# Patient Record
Sex: Female | Born: 1990 | Race: Black or African American | Hispanic: No | Marital: Single | Smoking: Never smoker
Health system: Southern US, Community
[De-identification: ages and names within clinical notes are randomized; demographics above are authoritative.]

## PROBLEM LIST (undated history)

## (undated) DIAGNOSIS — D649 Anemia, unspecified: Secondary | ICD-10-CM

## (undated) HISTORY — DX: Anemia, unspecified: D64.9

## (undated) HISTORY — PX: NO PAST SURGERIES: SHX2092

---
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
## 2011-12-02 ENCOUNTER — Inpatient Hospital Stay: Payer: Self-pay | Admitting: Student

## 2011-12-02 LAB — CBC
HCT: 32.8 % — ABNORMAL LOW (ref 35.0–47.0)
MCH: 26.3 pg (ref 26.0–34.0)
MCV: 82 fL (ref 80–100)
Platelet: 295 10*3/uL (ref 150–440)
RBC: 3.99 10*6/uL (ref 3.80–5.20)
WBC: 7.2 10*3/uL (ref 3.6–11.0)

## 2011-12-02 LAB — URINALYSIS, COMPLETE
Bilirubin,UR: NEGATIVE
Blood: NEGATIVE
Ketone: NEGATIVE
Ph: 5 (ref 4.5–8.0)
Specific Gravity: 1.018 (ref 1.003–1.030)
Squamous Epithelial: 1

## 2011-12-02 LAB — COMPREHENSIVE METABOLIC PANEL
Albumin: 4.3 g/dL (ref 3.8–5.6)
Alkaline Phosphatase: 47 U/L — ABNORMAL LOW (ref 82–169)
Anion Gap: 11 (ref 7–16)
BUN: 11 mg/dL (ref 7–18)
Bilirubin,Total: 0.3 mg/dL (ref 0.2–1.0)
Co2: 24 mmol/L (ref 21–32)
Creatinine: 0.83 mg/dL (ref 0.60–1.30)
EGFR (African American): >60
EGFR (Non-African Amer.): >60
Glucose: 86 mg/dL (ref 65–99)
Osmolality: 278 (ref 275–301)
Potassium: 4 mmol/L (ref 3.5–5.1)
SGPT (ALT): 12 U/L
Sodium: 140 mmol/L (ref 136–145)
Total Protein: 8.6 g/dL (ref 6.4–8.6)

## 2011-12-02 LAB — DRUG SCREEN, URINE
Cocaine Metabolite,Ur ~~LOC~~: NEGATIVE (ref ?–300)
MDMA (Ecstasy)Ur Screen: NEGATIVE (ref ?–500)
Opiate, Ur Screen: NEGATIVE (ref ?–300)
Tricyclic, Ur Screen: NEGATIVE (ref ?–1000)

## 2011-12-02 LAB — PREGNANCY, URINE: Pregnancy Test, Urine: NEGATIVE m[IU]/mL

## 2011-12-02 LAB — HCG, QUANTITATIVE, PREGNANCY: Beta Hcg, Quant.: <1 m[IU]/mL — ABNORMAL LOW

## 2011-12-03 LAB — LIPID PANEL
HDL Cholesterol: 44 mg/dL (ref 40–60)
VLDL Cholesterol, Calc: 6 mg/dL (ref 5–40)

## 2011-12-03 LAB — IRON AND TIBC
Iron Bind.Cap.(Total): 433 ug/dL (ref 250–450)
Iron: 37 ug/dL — ABNORMAL LOW (ref 50–170)
Unbound Iron-Bind.Cap.: 405 ug/dL

## 2011-12-04 LAB — CBC WITH DIFFERENTIAL/PLATELET
Basophil %: 0.1 %
Eosinophil #: 0.1 10*3/uL (ref 0.0–0.7)
HCT: 30.1 % — ABNORMAL LOW (ref 35.0–47.0)
Lymphocyte %: 42.8 %
Monocyte %: 7.7 %
Neutrophil #: 2.4 10*3/uL (ref 1.4–6.5)
Neutrophil %: 48.1 %
RBC: 3.68 10*6/uL — ABNORMAL LOW (ref 3.80–5.20)
RDW: 16 % — ABNORMAL HIGH (ref 11.5–14.5)
WBC: 4.9 10*3/uL (ref 3.6–11.0)

## 2015-03-03 NOTE — H&P (Signed)
PATIENT NAME:  Christina Ellis, Christina Ellis MR#:  [id number] DATE OF BIRTH:  27-Jan-1991  DATE OF ADMISSION:  12/02/2011  PRIMARY CARE PHYSICIAN: Irma Newness., MD   ER REFERRING PHYSICIAN: Eula Listen, MD    CHIEF COMPLAINT: Syncope.   HISTORY OF PRESENT ILLNESS: The patient is a 24 year old female with no past medical history, had an episode of syncope in the bathroom. The patient was coming out of the shower, and then she had an episode of syncope this morning; and she felt light-headed and dizzy before she passed out. She lost consciousness for two minutes, did not have any episodes of seizures and no incontinence. The family heard a big thud in the bathroom, and then the mother noticed she was a little confused. After that, her orientation was normal. The patient says that she has been having headache since the last 2 to 3 days, but it gets better after she eats. No trouble breathing. No chest pain. No nausea. No vomiting. No fever. No neck pain. No blurred vision. No palpitations.   PAST MEDICAL HISTORY: No hypertension, no diabetes. The patient's periods are regular, and she does not use any contraception and is not on birth control pills.   PAST SURGICAL HISTORY: None.   MEDICATIONS: None.   ALLERGIES: None.   SOCIAL HISTORY: No smoking, no drinking, no drugs. The patient [place] a sophomore at [employer] and plays volleyball and basketball.   FAMILY HISTORY: No history of deep venous thrombosis or PE.  REVIEW OF SYSTEMS: CONSTITUTIONAL: No fever. No fatigue. EYES: No blurred vision or double vision. ENT: No ear pain. No hearing loss. No difficulty swallowing. RESPIRATORY: No cough. No wheezing. CARDIOVASCULAR: No chest pain, no palpitations. GASTROINTESTINAL: No nausea. No vomiting. No abdominal pain. GENITOURINARY: No dysuria.  GYN: Periods are regular, not using any contraceptive pills, and not using birth control pills. ENDOCRINE: No polydipsia or nocturia. MUSCULOSKELETAL: No joint pain.  NEUROLOGIC: No numbness or weakness at this time in the hands or legs. No dysarthria. Headache is present, around 4 out of 10 in severity.   PHYSICAL EXAMINATION:  VITAL SIGNS: Blood pressure is 131/69, pulse is 80, respirations are 20, temperature 98.6.   GENERAL: The patient is alert, awake, oriented. A well-nourished female answering questions appropriately.   HEENT: Head: Atraumatic, normocephalic. Pupils are equally reacting to light. Extraocular movements are intact. The patient has no conjunctivitis. Hearing is intact. No turbinate hypertrophy. No oropharyngeal erythema.   NECK: No carotid bruit. No thyroid enlargement.   RESPIRATORY: Bilaterally clear to auscultation. No wheeze. No rales.   CARDIOVASCULAR: S1, S2 regular. No murmurs.   ABDOMEN: Soft, nontender, nondistended. Bowel sounds are present.   MUSCULOSKELETAL: Power 5 out of 5 in upper and lower extremities.   SKIN: The patient has no skin rashes.   LYMPH NODES: No lymphadenopathy.   NEUROLOGICAL: The patient's cranial nerves are intact II to XII. Power is 5 out of 5 in upper and lower extremities. Sensations are intact.    PSYCHIATRIC: Oriented to time, place, and person.   LABORATORY, DIAGNOSTIC AND RADIOLOGICAL DATA:  Chest x-ray showed no acute cardiopulmonary abnormality.  Left hip x-ray is normal.  CAT scan of the head showed lucency in the left lenticular nucleus which is like a lacunar infarct.  Electrolytes: Sodium 140, potassium 4, chloride 105, bicarbonate 24, BUN 11, creatinine 0.83, glucose 86.  Liver functions are within normal limits.  Pregnancy test is negative.  WBC 7.2, hemoglobin 10.3, hematocrit 32.8, and platelets 295.  Urinalysis:  Urine showed yellow hazy urine. No leukocyte esterase.  Urine toxicology is negative.  EKG showed normal sinus with 64 beats per minute. No ST-T changes.   ASSESSMENT AND PLAN: This is a 24 year old female with sudden onset of syncope and lacunar stroke on the  CAT scan. I will admit the patient and have a full stroke work-up in inpatient, including hypercoagulation work-up, and ANA, and also carotid ultrasound, and echocardiogram. She will have neuro checks every four hours. This was discussed with Dr. Loletta Specter, who is going to see the patient. The patient will also have MRA of the brain and use Tylenol p.r.n. for headache. We will monitor her on telemetry.   I discussed the plan with the patient's mother, and I also spoke with Dr. Loletta Specter over the phone.   TIME SPENT ON HISTORY AND PHYSICAL: About 55 minutes.   ____________________________ Epifanio Lesches, MD sk:cbb D: 12/02/2011 17:07:13 ET T: 12/02/2011 18:59:58 ET JOB#: [id number]  cc: Epifanio Lesches, MD, <Dictator> Frederik Schmidt., MD Epifanio Lesches MD ELECTRONICALLY SIGNED 12/28/2011 16:17

## 2015-03-03 NOTE — Discharge Summary (Signed)
PATIENT NAME:  Christina Ellis, Christina Ellis MR#:  [id number] DATE OF BIRTH:  Apr 02, 1991  DATE OF ADMISSION:  12/02/2011 DATE OF DISCHARGE:  12/04/2011  CONSULTANTS:  1. Michaela Corner, MD - Neurology.  2. Physical Therapy.   CHIEF COMPLAINT: Syncope.   DISCHARGE DIAGNOSES:  1. Syncope, likely situational. 2. Iron deficiency anemia.   DISCHARGE MEDICATIONS:  1. Iron sulfate 325 mg p.o. twice a day with meals. 2. Tylenol extra-strength 500 mg 2 tabs orally as needed for pain.   DIET: Regular diet.   ACTIVITY: As tolerated.   DISCHARGE FOLLOWUP: Please follow up with your OB/GYN and repeat a complete blood count in 2 to 4 weeks.   HISTORY OF PRESENT ILLNESS: The patient is a 24 year old African American female with no significant past medical history who presented with a syncopal episode on the day of admission. She had lost consciousness for 1 to 2 minutes. There was some dizziness prior and some headaches the last several days before that. For full history and physical, please see the dictation on 12/02/2011 by Dr. Epifanio Lesches.   Initial CAT scan of the head showed possible lacunar stroke and the patient was admitted to the hospitalist service for further evaluation and management.   SIGNIFICANT LABS/STUDIES: TSH was 0.916. Urine toxicology was essentially negative. Initial WBC was 7.2, hemoglobin 10.5, hematocrit 32.8, and platelets 295.   Urinalysis: No evidence for infection.  Vitamin B12, serum, 311 ANA comprehensive panel was essentially negative. Protein C and S panel essentially negative. Pregnancy test was negative.   Echocardiogram showed an ejection fraction of greater than 55%, borderline mitral valve prolapse and trace mitral regurgitation.  Carotid ultrasound showed no significant stenosis.   Initial CT of the brain showed losses in the left ventricular nucleus, possibly lacunar infarct.   X-ray of the chest, PA and lateral: No acute cardiopulmonary abnormalities.    MRI of the brain initially read as lacunar infarct in the left ventricular nucleus; however, ten section axial and coronal images obtained revealed that this was likely prominent CSF space and not an infarct.   HOSPITAL COURSE: The patient was admitted to telemetry and per the initial CAT scan extensive work-up was done to evaluate the stroke. Also neurology was consulted. The patient was also placed on telemetry to reevaluate for any significant arrhythmias. Upon further workup and conversation with the patient, the [place] the syncope. She had stood outside without any water or food for greater than 10 hours. She felt dizziness the night before and the day prior to the syncopal episode. Echocardiogram came back essentially normal as well as carotid ultrasounds. There was no significant valvular abnormality and ejection fraction was normal on the Echo. There was some borderline mitral valve prolapse, however. Dr. Loletta Specter was consulted and I discussed the case  with him. The lacunar infarct on MRI, on over-read, was more likely prominent CSF space and the MRI was repeated with thin slices and corrected. The patient has no CVA. The patient was also seen by physical therapy and was deemed appropriate to be discharged to home. Her lipid panel was within normal limits. Of note, the patient had low hemoglobin and hematocrit, iron studies were sent, and the patient apparently is iron deficient. She has history of anemia in the past. She can follow up with her OB/GYN as her menses are irregular and states at times has heavier bleeds. At this point, we will discharge her with iron sulfate 325 mg  p.o. twice a day and CBC could be checked in two to four weeks. The patient currently is ambulating without any significant dizziness or symptoms. We will discharge the patient to the care of her mother.    CODE STATUS: FULL CODE.   TOTAL TIME SPENT: 35 minutes.    ____________________________ Vivien Presto, MD sa:slb D: 12/04/2011 11:20:28 ET T: 12/04/2011 11:54:51 ET JOB#: [id number]  cc: Vivien Presto, MD, <Dictator> Vivien Presto MD ELECTRONICALLY SIGNED 12/08/2011 18:41

## 2017-01-31 ENCOUNTER — Emergency Department: Payer: Commercial Managed Care - PPO

## 2017-01-31 ENCOUNTER — Encounter: Payer: Self-pay | Admitting: *Deleted

## 2017-01-31 ENCOUNTER — Emergency Department
Admission: EM | Admit: 2017-01-31 | Discharge: 2017-01-31 | Disposition: A | Discharge status: Home / Self Care | Payer: Commercial Managed Care - PPO | Attending: Emergency Medicine | Admitting: Emergency Medicine

## 2017-01-31 DIAGNOSIS — R05 Cough: Secondary | ICD-10-CM | POA: Diagnosis present

## 2017-01-31 DIAGNOSIS — B349 Viral infection, unspecified: Secondary | ICD-10-CM | POA: Diagnosis not present

## 2017-01-31 MED ORDER — ONDANSETRON 4 MG PO TBDP
4.0000 mg | ORAL_TABLET | Freq: Once | ORAL | Status: AC
  Administered 2017-01-31: 4 mg via ORAL
  Filled 2017-01-31: qty 1

## 2017-01-31 MED ORDER — ONDANSETRON HCL 4 MG PO TABS: 4.0000 mg | ORAL_TABLET | Freq: Three times a day (TID) | ORAL | 0 refills | Status: DC | PRN

## 2017-01-31 NOTE — ED Notes (Signed)
Exposed to flu, symptoms starting x3 days ago. Chest congestion, cough , fever , body aches

## 2017-01-31 NOTE — ED Triage Notes (Signed)
Pt complains of cough , fever, congestion, pt reports vomiting once today

## 2017-01-31 NOTE — ED Provider Notes (Signed)
[facility] Emergency Department Provider Note  ____________________________________________   I have reviewed the triage vital signs and the nursing notes.   HISTORY  Chief Complaint Influenza    HPI Christina Ellis is a 26 y.o. female who presents here with flu symptoms. Her multiple family members have had the flu. Please started on Wednesday. Today is Sunday. Did have 1 episode of posttussive emesis today. Cough, she had she has not had a fever. Her MAXIMUM TEMPERATURE was 98.8 but she did feel warm. She has had body aches. She is otherwise healthy patient. She is concerned because her family member ended up getting a pneumonia on top of the fluid she is worried that that might happen to her. She does not have a productive cough.     History reviewed. No pertinent past medical history.  There are no active problems to display for this patient.   History reviewed. No pertinent surgical history.  Prior to Admission medications   Not on File    Allergies Patient has no allergy information on record.  No family history on file.  Social History Social History  Substance Use Topics  . Smoking status: Never Smoker  . Smokeless tobacco: Not on file  . Alcohol use No    Review of Systems Constitutional: No fever/ Positive chills Eyes: No visual changes. ENT: No sore throat. No stiff neck no neck pain Cardiovascular: Denies chest pain. Respiratory: Denies shortness of breath. Gastrointestinal:   Did vomit 1 after coughing .  No diarrhea.  No constipation. Genitourinary: Negative for dysuria. Musculoskeletal: Negative lower extremity swelling Skin: Negative for rash. Neurological: Negative for severe headaches, focal weakness or numbness. 10-point ROS otherwise negative.  ____________________________________________   PHYSICAL EXAM:  VITAL SIGNS: ED Triage Vitals  Enc Vitals Group     BP 01/31/17 1051 118/80     Pulse Rate  01/31/17 1051 93     Resp 01/31/17 1051 20     Temp 01/31/17 1051 99.3 F (37.4 C)     Temp Source 01/31/17 1051 Oral     SpO2 01/31/17 1051 98 %     Weight 01/31/17 1043 215 lb (97.5 kg)     Height 01/31/17 1043 5\' 8"  (1.727 m)     Head Circumference --      Peak Flow --      Pain Score 01/31/17 1106 0     Pain Loc --      Pain Edu? --      Excl. in [place]? --     Constitutional: Alert and oriented. Well appearing and in no acute distress.Patient is nontoxic medically she appears as if she might have the flu Eyes: Conjunctivae are normal. PERRL. EOMI. Head: Atraumatic. Nose: No congestion/rhinnorhea. Mouth/Throat: Mucous membranes are moist.  Oropharynx non-erythematous. Neck: No stridor.   Nontender with no meningismus Cardiovascular: Normal rate, regular rhythm. Grossly normal heart sounds.  Good peripheral circulation. Respiratory: Normal respiratory effort.  No retractions. Lungs CTAB. Abdominal: Soft and nontender. No distention. No guarding no rebound Back:  There is no focal tenderness or step off.  there is no midline tenderness there are no lesions noted. there is no CVA tenderness Musculoskeletal: No lower extremity tenderness, no upper extremity tenderness. No joint effusions, no DVT signs strong distal pulses no edema Neurologic:  Normal speech and language. No gross focal neurologic deficits are appreciated.  Skin:  Skin is warm, dry and intact. No rash noted. Psychiatric: Mood and affect are normal. Speech and  behavior are normal.  ____________________________________________   LABS (all labs ordered are listed, but only abnormal results are displayed)  Labs Reviewed - No data to display ____________________________________________  EKG  I personally interpreted any EKGs ordered by me or triage  ____________________________________________  RADIOLOGY  I reviewed any imaging ordered by me or triage that were performed during my shift and, if possible, patient  and/or family made aware of any abnormal findings. ____________________________________________   PROCEDURES  Procedure(s) performed: None  Procedures  Critical Care performed: None  ____________________________________________   INITIAL IMPRESSION / ASSESSMENT AND PLAN / ED COURSE  Pertinent labs & imaging results that were available during my care of the patient were reviewed by me and considered in my medical decision making (see chart for details).  Patient with flu symptoms very well-appearing vital signs stable outside the window for Tamiflu, and likely will require supportive care. We'll obtain a chest x-ray as a precaution. We'll give her antiemetics.    ____________________________________________   FINAL CLINICAL IMPRESSION(S) / ED DIAGNOSES  Final diagnoses:  None      This chart was dictated using voice recognition software.  Despite best efforts to proofread,  errors can occur which can change meaning.      Schuyler Amor, MD 01/31/17 [phone]

## 2018-02-19 ENCOUNTER — Emergency Department: Payer: Commercial Managed Care - PPO

## 2018-02-19 ENCOUNTER — Other Ambulatory Visit: Payer: Self-pay

## 2018-02-19 ENCOUNTER — Encounter: Payer: Self-pay | Admitting: Emergency Medicine

## 2018-02-19 ENCOUNTER — Emergency Department
Admission: EM | Admit: 2018-02-19 | Discharge: 2018-02-19 | Disposition: A | Discharge status: Home / Self Care | Payer: Commercial Managed Care - PPO | Attending: Emergency Medicine | Admitting: Emergency Medicine

## 2018-02-19 DIAGNOSIS — S0083XA Contusion of other part of head, initial encounter: Secondary | ICD-10-CM | POA: Diagnosis not present

## 2018-02-19 DIAGNOSIS — Y9389 Activity, other specified: Secondary | ICD-10-CM | POA: Diagnosis not present

## 2018-02-19 DIAGNOSIS — Y92410 Unspecified street and highway as the place of occurrence of the external cause: Secondary | ICD-10-CM | POA: Diagnosis not present

## 2018-02-19 DIAGNOSIS — Y998 Other external cause status: Secondary | ICD-10-CM | POA: Insufficient documentation

## 2018-02-19 DIAGNOSIS — S060X0A Concussion without loss of consciousness, initial encounter: Secondary | ICD-10-CM | POA: Insufficient documentation

## 2018-02-19 DIAGNOSIS — S0990XA Unspecified injury of head, initial encounter: Secondary | ICD-10-CM | POA: Diagnosis present

## 2018-02-19 MED ORDER — ONDANSETRON 8 MG PO TBDP
8.0000 mg | ORAL_TABLET | Freq: Once | ORAL | Status: AC
  Administered 2018-02-19: 8 mg via ORAL
  Filled 2018-02-19: qty 1

## 2018-02-19 MED ORDER — MELOXICAM 15 MG PO TABS: 15.0000 mg | ORAL_TABLET | Freq: Every day | ORAL | 0 refills | Status: DC

## 2018-02-19 MED ORDER — ORPHENADRINE CITRATE 30 MG/ML IJ SOLN
60.0000 mg | Freq: Once | INTRAMUSCULAR | Status: AC
  Administered 2018-02-19: 60 mg via INTRAMUSCULAR
  Filled 2018-02-19: qty 2

## 2018-02-19 MED ORDER — KETOROLAC TROMETHAMINE 30 MG/ML IJ SOLN
30.0000 mg | Freq: Once | INTRAMUSCULAR | Status: AC
  Administered 2018-02-19: 30 mg via INTRAMUSCULAR
  Filled 2018-02-19: qty 1

## 2018-02-19 MED ORDER — METHOCARBAMOL 500 MG PO TABS: 500.0000 mg | ORAL_TABLET | Freq: Four times a day (QID) | ORAL | 0 refills | Status: DC
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
## 2019-07-21 ENCOUNTER — Other Ambulatory Visit: Payer: Self-pay | Admitting: *Deleted

## 2019-07-21 DIAGNOSIS — Z20822 Contact with and (suspected) exposure to covid-19: Secondary | ICD-10-CM

## 2019-07-23 LAB — NOVEL CORONAVIRUS, NAA: SARS-CoV-2, NAA: NOT DETECTED

## 2019-08-21 ENCOUNTER — Other Ambulatory Visit: Payer: Self-pay

## 2019-08-21 DIAGNOSIS — Z20822 Contact with and (suspected) exposure to covid-19: Secondary | ICD-10-CM

## 2019-08-22 LAB — NOVEL CORONAVIRUS, NAA: SARS-CoV-2, NAA: DETECTED — AB

## 2019-08-31 ENCOUNTER — Other Ambulatory Visit: Payer: Self-pay

## 2019-08-31 DIAGNOSIS — Z20822 Contact with and (suspected) exposure to covid-19: Secondary | ICD-10-CM

## 2019-09-02 LAB — NOVEL CORONAVIRUS, NAA: SARS-CoV-2, NAA: NOT DETECTED

## 2020-03-05 DIAGNOSIS — E119 Type 2 diabetes mellitus without complications: Secondary | ICD-10-CM | POA: Insufficient documentation

## 2020-03-06 ENCOUNTER — Ambulatory Visit: Payer: Commercial Managed Care - PPO | Attending: Internal Medicine

## 2020-03-06 DIAGNOSIS — Z23 Encounter for immunization: Secondary | ICD-10-CM

## 2020-03-06 NOTE — Progress Notes (Signed)
   Covid-19 Vaccination Clinic  Name:  Christina Ellis    MRN: [id number] DOB: 09-09-1991  03/06/2020  Christina Ellis was observed post Covid-19 immunization for 15 minutes without incident. She was provided with Vaccine Information Sheet and instruction to access the V-Safe system.   Christina Ellis was instructed to call 911 with any severe reactions post vaccine: Marland Kitchen Difficulty breathing  . Swelling of face and throat  . A fast heartbeat  . A bad rash all over body  . Dizziness and weakness   Immunizations Administered    Name Date Dose VIS Date Route   Pfizer COVID-19 Vaccine 03/06/2020  1:40 PM 0.3 mL 01/03/2019 Intramuscular   Manufacturer: [place]   Lot: [id number]   [place]: [id number]

## 2020-04-01 ENCOUNTER — Ambulatory Visit: Payer: Commercial Managed Care - PPO | Attending: Internal Medicine

## 2020-04-01 DIAGNOSIS — Z23 Encounter for immunization: Secondary | ICD-10-CM

## 2020-04-01 NOTE — Progress Notes (Signed)
   Covid-19 Vaccination Clinic  Name:  Christina Ellis    MRN: [id number] DOB: [birth year]  04/01/2020  [name] was observed post Covid-19 immunization for 15 minutes without incident. She was provided with Vaccine Information Sheet and instruction to access the V-Safe system.   [name] was instructed to call 911 with any severe reactions post vaccine: Marland Kitchen Difficulty breathing  . Swelling of face and throat  . A fast heartbeat  . A bad rash all over body  . Dizziness and weakness   Immunizations Administered    Name Date Dose VIS Date Route   Pfizer COVID-19 Vaccine 04/01/2020  9:48 AM 0.3 mL 01/03/2019 Intramuscular   Manufacturer: [place]   Lot: [id number]   [place]: [id number]

## 2020-12-23 DIAGNOSIS — M549 Dorsalgia, unspecified: Secondary | ICD-10-CM | POA: Diagnosis not present

## 2020-12-23 DIAGNOSIS — Z0000 Encounter for general adult medical examination without abnormal findings: Secondary | ICD-10-CM | POA: Diagnosis not present

## 2020-12-23 DIAGNOSIS — E119 Type 2 diabetes mellitus without complications: Secondary | ICD-10-CM | POA: Diagnosis not present

## 2020-12-23 DIAGNOSIS — Z1331 Encounter for screening for depression: Secondary | ICD-10-CM | POA: Diagnosis not present

## 2020-12-23 DIAGNOSIS — D509 Iron deficiency anemia, unspecified: Secondary | ICD-10-CM | POA: Diagnosis not present

## 2020-12-24 DIAGNOSIS — D509 Iron deficiency anemia, unspecified: Secondary | ICD-10-CM | POA: Diagnosis not present

## 2020-12-24 DIAGNOSIS — E119 Type 2 diabetes mellitus without complications: Secondary | ICD-10-CM | POA: Diagnosis not present

## 2021-01-10 ENCOUNTER — Other Ambulatory Visit: Payer: Self-pay

## 2021-01-10 ENCOUNTER — Inpatient Hospital Stay: Payer: 59

## 2021-01-10 ENCOUNTER — Encounter: Payer: Self-pay | Admitting: Internal Medicine

## 2021-01-10 ENCOUNTER — Inpatient Hospital Stay: Payer: 59 | Attending: Internal Medicine | Admitting: Internal Medicine

## 2021-01-10 VITALS — BP 129/84 | HR 65 | Temp 98.2°F | Resp 20 | Ht 67.0 in | Wt 234.0 lb

## 2021-01-10 DIAGNOSIS — Z800 Family history of malignant neoplasm of digestive organs: Secondary | ICD-10-CM | POA: Insufficient documentation

## 2021-01-10 DIAGNOSIS — Z79899 Other long term (current) drug therapy: Secondary | ICD-10-CM | POA: Diagnosis not present

## 2021-01-10 DIAGNOSIS — D500 Iron deficiency anemia secondary to blood loss (chronic): Secondary | ICD-10-CM | POA: Diagnosis not present

## 2021-01-10 DIAGNOSIS — N920 Excessive and frequent menstruation with regular cycle: Secondary | ICD-10-CM | POA: Diagnosis not present

## 2021-01-10 DIAGNOSIS — Z809 Family history of malignant neoplasm, unspecified: Secondary | ICD-10-CM | POA: Insufficient documentation

## 2021-01-10 NOTE — Assessment & Plan Note (Addendum)
#  Iron deficient anemia likely secondary to chronic blood loss/menorrhagia.  Hemoglobin around 9.  Given the severe iron deficiency would recommend IV iron.  Discussed the potential acute infusion reactions with IV iron; which are quite rare.  Patient understands the risk; will proceed with infusions.  #Menorrhagia-recommend GYN evaluation.  Thank you  for allowing me to participate in the care of your pleasant patient. Please do not hesitate to contact me with questions or concerns in the interim.  # DISPOSITION: # [facility]-re: heavy menstrual cycles # venofer weekly x4- start mid-next week # follow up in 7 weeks- MD; labs- cbc/urine pregnancy- possible venofer Dr.B

## 2021-01-10 NOTE — Progress Notes (Signed)
[place] CONSULT NOTE  Patient Care Team: Patient, No Pcp Per as PCP - General (General Practice)  CHIEF COMPLAINTS/PURPOSE OF CONSULTATION: ANEMIA   HEMATOLOGY HISTORY:  # IRON DEF ANEMIA-likely secondary menorrhagia [EGD/colonoscopy/capsule/Bone marrow Biopsy-NONE]  HISTORY OF PRESENTING ILLNESS:  Christina Ellis 30 y.o.  female has been referred to [place] for further evaluation/work-up for anemia.  Patient complains of worsening fatigue; shortness of exertion.  Patient has difficulty in concentration.  Patient has history of heavy menstrual cycles.  Blood in stools: None Change in bowel habits- None Blood in urine: None Difficulty swallowing: None Abnormal weight loss: None Iron supplementation: non-compliance sec to nausea.  Prior Blood transfusions: none Bariatric surgery: None EGD/Colonoscopy:none  Vaginal bleeding: heavy cycles.    Review of Systems  Constitutional: Positive for malaise/fatigue. Negative for chills, diaphoresis, fever and weight loss.  HENT: Negative for nosebleeds and sore throat.   Eyes: Negative for double vision.  Respiratory: Negative for cough, hemoptysis, sputum production, shortness of breath and wheezing.   Cardiovascular: Negative for chest pain, palpitations, orthopnea and leg swelling.  Gastrointestinal: Negative for abdominal pain, blood in stool, constipation, diarrhea, heartburn, melena, nausea and vomiting.  Genitourinary: Negative for dysuria, frequency and urgency.  Musculoskeletal: Negative for back pain and joint pain.  Skin: Negative.  Negative for itching and rash.  Neurological: Positive for dizziness. Negative for tingling, focal weakness, weakness and headaches.  Endo/Heme/Allergies: Does not bruise/bleed easily.  Psychiatric/Behavioral: Negative for depression. The patient is not nervous/anxious and does not have insomnia.     MEDICAL HISTORY:  Past Medical History:  Diagnosis Date  . Anemia      SURGICAL HISTORY: No past surgical history on file.  SOCIAL HISTORY: Social History   Socioeconomic History  . Marital status: Single    Spouse name: Not on file  . Number of children: Not on file  . Years of education: Not on file  . Highest education level: Not on file  Occupational History  . Not on file  Tobacco Use  . Smoking status: Never Smoker  . Smokeless tobacco: Never Used  Vaping Use  . Vaping Use: Never used  Substance and Sexual Activity  . Alcohol use: No    Comment: occasional glass of wine  . Drug use: Not on file  . Sexual activity: Not on file  Other Topics Concern  . 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 Lives in [place]; with mom.    Social Determinants of Health   Financial Resource Strain: Not on file  Food Insecurity: Not on file  Transportation Needs: Not on file  Physical Activity: Not on file  Stress: Not on file  Social Connections: Not on file  Intimate Partner Violence: Not on file    FAMILY HISTORY: Family History  Problem Relation Age of Onset  . Anemia Mother   . Thyroid cancer Maternal Grandmother   . Pancreatic cancer Maternal Grandfather     ALLERGIES:  is allergic to latex.  MEDICATIONS:  Current Outpatient Medications  Medication Sig Dispense Refill  . ferrous sulfate 325 (65 FE) MG tablet Take 1 tablet by mouth daily with breakfast.    . Multiple Vitamins-Minerals (MULTIVITAMIN ADULT) CHEW Chew by mouth.    . Omega-3 Fatty Acids (FISH OIL) 1000 MG CAPS Take 1 capsule by mouth in the morning and at bedtime.     No current facility-administered medications for this visit.  PHYSICAL EXAMINATION:   Vitals:   01/10/21 1515  BP: 129/84  Pulse: 65  Resp: 20  Temp: 98.2 F (36.8 C)   Filed Weights   01/10/21 1519  Weight: 234 lb (106.1 kg)    Physical Exam Constitutional:      Comments: Ambulating; with mother.   HENT:     Head:  Normocephalic and atraumatic.     Mouth/Throat:     Mouth: Oropharynx is clear and moist.     Pharynx: No oropharyngeal exudate.  Eyes:     Pupils: Pupils are equal, round, and reactive to light.  Cardiovascular:     Rate and Rhythm: Normal rate and regular rhythm.  Pulmonary:     Effort: Pulmonary effort is normal. No respiratory distress.     Breath sounds: Normal breath sounds. No wheezing.  Abdominal:     General: Bowel sounds are normal. There is no distension.     Palpations: Abdomen is soft. There is no mass.     Tenderness: There is no abdominal tenderness. There is no guarding or rebound.  Musculoskeletal:        General: No tenderness or edema. Normal range of motion.     Cervical back: Normal range of motion and neck supple.  Skin:    General: Skin is warm.  Neurological:     Mental Status: She is alert and oriented to person, place, and time.  Psychiatric:        Mood and Affect: Affect normal.     LABORATORY DATA:  I have reviewed the data as listed Lab Results  Component Value Date   WBC 4.9 12/04/2011   HGB 9.7 (L) 12/04/2011   HCT 30.1 (L) 12/04/2011   MCV 82 12/04/2011   PLT 265 12/04/2011   No results for input(s): NA, K, CL, CO2, GLUCOSE, BUN, CREATININE, CALCIUM, GFRNONAA, GFRAA, PROT, ALBUMIN, AST, ALT, ALKPHOS, BILITOT, BILIDIR, IBILI in the last 8760 hours.   No results found.  Iron deficiency anemia due to chronic blood loss #Iron deficient anemia likely secondary to chronic blood loss/menorrhagia.  Hemoglobin around 9.  Given the severe iron deficiency would recommend IV iron.  Discussed the potential acute infusion reactions with IV iron; which are quite rare.  Patient understands the risk; will proceed with infusions.  #Menorrhagia-recommend GYN evaluation.  Thank you  for allowing me to participate in the care of your pleasant patient. Please do not hesitate to contact me with questions or concerns in the interim.  # DISPOSITION: # [facility]: heavy menstrual cycles # venofer weekly x4- start mid-next week # follow up in 7 weeks- MD; labs- cbc/urine pregnancy- possible venofer Dr.B     All questions were answered. The patient knows to call the clinic with any problems, questions or concerns.      [name], MD 01/13/2021 12:32 PM

## 2021-01-13 ENCOUNTER — Telehealth: Payer: Self-pay

## 2021-01-13 NOTE — Telephone Encounter (Signed)
[facility] referring for Heavy menses; iron def. Anemia. Called and left voicemail for patient to call back to be scheduled.

## 2021-01-14 ENCOUNTER — Inpatient Hospital Stay: Payer: 59

## 2021-01-14 VITALS — BP 113/76 | HR 75 | Temp 97.5°F | Resp 20

## 2021-01-14 DIAGNOSIS — D500 Iron deficiency anemia secondary to blood loss (chronic): Secondary | ICD-10-CM | POA: Diagnosis not present

## 2021-01-14 DIAGNOSIS — N920 Excessive and frequent menstruation with regular cycle: Secondary | ICD-10-CM | POA: Diagnosis not present

## 2021-01-14 DIAGNOSIS — Z800 Family history of malignant neoplasm of digestive organs: Secondary | ICD-10-CM | POA: Diagnosis not present

## 2021-01-14 DIAGNOSIS — Z79899 Other long term (current) drug therapy: Secondary | ICD-10-CM | POA: Diagnosis not present

## 2021-01-14 DIAGNOSIS — Z809 Family history of malignant neoplasm, unspecified: Secondary | ICD-10-CM | POA: Diagnosis not present

## 2021-01-14 MED ORDER — SODIUM CHLORIDE 0.9 % IV SOLN
Freq: Once | INTRAVENOUS | Status: AC
  Filled 2021-01-14: qty 250

## 2021-01-14 MED ORDER — SODIUM CHLORIDE 0.9 % IV SOLN: 200.0000 mg | Freq: Once | INTRAVENOUS | Status: DC

## 2021-01-14 MED ORDER — IRON SUCROSE 20 MG/ML IV SOLN
200.0000 mg | Freq: Once | INTRAVENOUS | Status: AC
  Administered 2021-01-14: 200 mg via INTRAVENOUS
  Filled 2021-01-14: qty 10

## 2021-01-14 NOTE — Telephone Encounter (Signed)
Called and spoke with patient about scheduling appointment. Patient wishes to call back to be scheduled

## 2021-01-21 ENCOUNTER — Inpatient Hospital Stay: Payer: 59

## 2021-01-21 VITALS — BP 110/74 | HR 72 | Temp 96.9°F | Resp 18

## 2021-01-21 DIAGNOSIS — N920 Excessive and frequent menstruation with regular cycle: Secondary | ICD-10-CM | POA: Diagnosis not present

## 2021-01-21 DIAGNOSIS — Z800 Family history of malignant neoplasm of digestive organs: Secondary | ICD-10-CM | POA: Diagnosis not present

## 2021-01-21 DIAGNOSIS — Z809 Family history of malignant neoplasm, unspecified: Secondary | ICD-10-CM | POA: Diagnosis not present

## 2021-01-21 DIAGNOSIS — Z79899 Other long term (current) drug therapy: Secondary | ICD-10-CM | POA: Diagnosis not present

## 2021-01-21 DIAGNOSIS — D500 Iron deficiency anemia secondary to blood loss (chronic): Secondary | ICD-10-CM

## 2021-01-21 MED ORDER — SODIUM CHLORIDE 0.9 % IV SOLN
Freq: Once | INTRAVENOUS | Status: AC
  Filled 2021-01-21: qty 250

## 2021-01-21 MED ORDER — SODIUM CHLORIDE 0.9 % IV SOLN: 200.0000 mg | Freq: Once | INTRAVENOUS | Status: DC

## 2021-01-21 MED ORDER — IRON SUCROSE 20 MG/ML IV SOLN
200.0000 mg | Freq: Once | INTRAVENOUS | Status: AC
  Administered 2021-01-21: 200 mg via INTRAVENOUS
  Filled 2021-01-21: qty 10

## 2021-01-28 ENCOUNTER — Inpatient Hospital Stay: Payer: 59

## 2021-01-28 VITALS — BP 117/76 | HR 71 | Resp 18

## 2021-01-28 DIAGNOSIS — N920 Excessive and frequent menstruation with regular cycle: Secondary | ICD-10-CM | POA: Diagnosis not present

## 2021-01-28 DIAGNOSIS — Z809 Family history of malignant neoplasm, unspecified: Secondary | ICD-10-CM | POA: Diagnosis not present

## 2021-01-28 DIAGNOSIS — D500 Iron deficiency anemia secondary to blood loss (chronic): Secondary | ICD-10-CM | POA: Diagnosis not present

## 2021-01-28 DIAGNOSIS — Z800 Family history of malignant neoplasm of digestive organs: Secondary | ICD-10-CM | POA: Diagnosis not present

## 2021-01-28 DIAGNOSIS — Z79899 Other long term (current) drug therapy: Secondary | ICD-10-CM | POA: Diagnosis not present

## 2021-01-28 MED ORDER — SODIUM CHLORIDE 0.9 % IV SOLN: 200.0000 mg | Freq: Once | INTRAVENOUS | Status: DC

## 2021-01-28 MED ORDER — IRON SUCROSE 20 MG/ML IV SOLN
200.0000 mg | Freq: Once | INTRAVENOUS | Status: AC
  Administered 2021-01-28: 200 mg via INTRAVENOUS
  Filled 2021-01-28: qty 10

## 2021-01-28 MED ORDER — SODIUM CHLORIDE 0.9 % IV SOLN
Freq: Once | INTRAVENOUS | Status: AC
  Filled 2021-01-28: qty 250

## 2021-02-04 ENCOUNTER — Other Ambulatory Visit: Payer: Self-pay

## 2021-02-04 ENCOUNTER — Inpatient Hospital Stay: Payer: 59

## 2021-02-04 VITALS — BP 123/74 | HR 70 | Temp 97.4°F | Resp 18

## 2021-02-04 DIAGNOSIS — D500 Iron deficiency anemia secondary to blood loss (chronic): Secondary | ICD-10-CM

## 2021-02-04 DIAGNOSIS — Z809 Family history of malignant neoplasm, unspecified: Secondary | ICD-10-CM | POA: Diagnosis not present

## 2021-02-04 DIAGNOSIS — N920 Excessive and frequent menstruation with regular cycle: Secondary | ICD-10-CM | POA: Diagnosis not present

## 2021-02-04 DIAGNOSIS — Z79899 Other long term (current) drug therapy: Secondary | ICD-10-CM | POA: Diagnosis not present

## 2021-02-04 DIAGNOSIS — Z800 Family history of malignant neoplasm of digestive organs: Secondary | ICD-10-CM | POA: Diagnosis not present

## 2021-02-04 MED ORDER — SODIUM CHLORIDE 0.9 % IV SOLN
Freq: Once | INTRAVENOUS | Status: AC
  Filled 2021-02-04: qty 250

## 2021-02-04 MED ORDER — SODIUM CHLORIDE 0.9 % IV SOLN: 200.0000 mg | Freq: Once | INTRAVENOUS | Status: DC

## 2021-02-04 MED ORDER — IRON SUCROSE 20 MG/ML IV SOLN
200.0000 mg | Freq: Once | INTRAVENOUS | Status: AC
  Administered 2021-02-04: 200 mg via INTRAVENOUS
  Filled 2021-02-04: qty 10

## 2021-02-04 NOTE — Progress Notes (Signed)
1400: pt states she has not had her period this month. Pt states her last period was the week before she saw Dr. Rogue Bussing ( she saw Dr. Rogue Bussing on 01/10/21, which would put her LMP being the end of Feb (approx 01/03/21)). Pt denies any concerns or possibility of pregnancy. Per Dr. Rogue Bussing okay to proceed with Venofer at this time.    1435: pt olerated infusion well, no s/s of distress or reaction noted. Pt stable at discharge.

## 2021-02-19 ENCOUNTER — Encounter: Payer: Self-pay | Admitting: Podiatry

## 2021-02-19 ENCOUNTER — Other Ambulatory Visit: Payer: Self-pay

## 2021-02-19 ENCOUNTER — Other Ambulatory Visit: Payer: Self-pay | Admitting: Podiatry

## 2021-02-19 ENCOUNTER — Ambulatory Visit (INDEPENDENT_AMBULATORY_CARE_PROVIDER_SITE_OTHER): Payer: 59

## 2021-02-19 ENCOUNTER — Ambulatory Visit: Payer: 59 | Admitting: Podiatry

## 2021-02-19 DIAGNOSIS — G5761 Lesion of plantar nerve, right lower limb: Secondary | ICD-10-CM | POA: Diagnosis not present

## 2021-02-19 DIAGNOSIS — G5781 Other specified mononeuropathies of right lower limb: Secondary | ICD-10-CM

## 2021-02-19 DIAGNOSIS — M778 Other enthesopathies, not elsewhere classified: Secondary | ICD-10-CM

## 2021-02-19 MED ORDER — MELOXICAM 15 MG PO TABS: 15.0000 mg | ORAL_TABLET | Freq: Every day | ORAL | 3 refills | Status: DC

## 2021-02-19 MED ORDER — TRIAMCINOLONE ACETONIDE 40 MG/ML IJ SUSP
20.0000 mg | Freq: Once | INTRAMUSCULAR | Status: AC
  Administered 2021-02-19: 20 mg

## 2021-02-19 NOTE — Progress Notes (Signed)
  Subjective:  Patient ID: Christina Ellis, female    DOB: [birth year],  MRN: [id number] HPI Chief Complaint  Patient presents with  . Foot Pain    Dorsal forefoot right - injury x 1 year ago-dropped something onto foot, increasingly painful since, throbbing, radiates into 3rd and 4th toes and into calf with activity  . New Patient (Initial Visit)    30 y.o. female presents with the above complaint.   ROS: Denies fever chills nausea vomiting muscle aches pains calf pain back pain chest pain shortness of breath.  Past Medical History:  Diagnosis Date  . Anemia    No past surgical history on file.  Current Outpatient Medications:  .  meloxicam (MOBIC) 15 MG tablet, Take 1 tablet (15 mg total) by mouth daily., Disp: 30 tablet, Rfl: 3 .  ferrous sulfate 325 (65 FE) MG tablet, Take 1 tablet by mouth daily with breakfast., Disp: , Rfl:  .  Multiple Vitamins-Minerals (MULTIVITAMIN ADULT) CHEW, Chew by mouth., Disp: , Rfl:  .  Omega-3 Fatty Acids (FISH OIL) 1000 MG CAPS, Take 1 capsule by mouth in the morning and at bedtime., Disp: , Rfl:   Allergies  Allergen Reactions  . Latex Itching   Review of Systems Objective:  There were no vitals filed for this visit.  General: Well developed, nourished, in no acute distress, alert and oriented x3   Dermatological: Skin is warm, dry and supple bilateral. Nails x 10 are well maintained; remaining integument appears unremarkable at this time. There are no open sores, no preulcerative lesions, no rash or signs of infection present.  Vascular: Dorsalis Pedis artery and Posterior Tibial artery pedal pulses are 2/4 bilateral with immedate capillary fill time. Pedal hair growth present. No varicosities and no lower extremity edema present bilateral.   Neruologic: Grossly intact via light touch bilateral. Vibratory intact via tuning fork bilateral. Protective threshold with Semmes Wienstein monofilament intact to all pedal sites bilateral. Patellar  and Achilles deep tendon reflexes 2+ bilateral. No Babinski or clonus noted bilateral.   Musculoskeletal: No gross boney pedal deformities bilateral. No pain, crepitus, or limitation noted with foot and ankle range of motion bilateral. Muscular strength 5/5 in all groups tested bilateral.  Pain on palpation to the third interdigital space of the right foot.  She also has some tenderness on palpation at the fourth fifth tarsometatarsal joint with no heel pain.  Gait: Unassisted, Nonantalgic.    Radiographs:  Radiographs taken today demonstrate an osseously mature individual no fractures are identified no acute findings.  No malalignment.  Assessment & Plan:   Assessment: Neuroma third interspace right foot  Plan: Injected the area today after sterile Betadine skin prep and ethyl chloride.  I injected 10 mg of Kenalog 5 mg Marcaine point of maximal tenderness.  We discussed the possible need for dehydrated alcohol or excision.  She understands this is amenable to it.     Eupha Lobb T. [place], [place]

## 2021-02-28 ENCOUNTER — Other Ambulatory Visit: Payer: Self-pay

## 2021-02-28 ENCOUNTER — Encounter: Payer: Self-pay | Admitting: Internal Medicine

## 2021-02-28 ENCOUNTER — Inpatient Hospital Stay (HOSPITAL_BASED_OUTPATIENT_CLINIC_OR_DEPARTMENT_OTHER): Payer: 59 | Admitting: Internal Medicine

## 2021-02-28 ENCOUNTER — Inpatient Hospital Stay: Payer: 59

## 2021-02-28 ENCOUNTER — Inpatient Hospital Stay: Payer: 59 | Attending: Internal Medicine

## 2021-02-28 VITALS — BP 123/80 | HR 76 | Resp 18

## 2021-02-28 DIAGNOSIS — D500 Iron deficiency anemia secondary to blood loss (chronic): Secondary | ICD-10-CM

## 2021-02-28 DIAGNOSIS — N920 Excessive and frequent menstruation with regular cycle: Secondary | ICD-10-CM | POA: Diagnosis not present

## 2021-02-28 LAB — CBC WITH DIFFERENTIAL/PLATELET
Abs Immature Granulocytes: 0.02 10*3/uL (ref 0.00–0.07)
Basophils Absolute: 0 10*3/uL (ref 0.0–0.1)
Basophils Relative: 1 %
Eosinophils Absolute: 0.1 10*3/uL (ref 0.0–0.5)
Eosinophils Relative: 2 %
HCT: 35.4 % — ABNORMAL LOW (ref 36.0–46.0)
Hemoglobin: 11.1 g/dL — ABNORMAL LOW (ref 12.0–15.0)
Immature Granulocytes: 0 %
Lymphocytes Relative: 36 %
Lymphs Abs: 2.4 10*3/uL (ref 0.7–4.0)
MCH: 26.3 pg (ref 26.0–34.0)
MCHC: 31.4 g/dL (ref 30.0–36.0)
MCV: 83.9 fL (ref 80.0–100.0)
Monocytes Absolute: 0.4 10*3/uL (ref 0.1–1.0)
Monocytes Relative: 6 %
Neutro Abs: 3.7 10*3/uL (ref 1.7–7.7)
Neutrophils Relative %: 55 %
Platelets: 295 10*3/uL (ref 150–400)
RBC: 4.22 MIL/uL (ref 3.87–5.11)
RDW: 21.2 % — ABNORMAL HIGH (ref 11.5–15.5)
WBC: 6.7 10*3/uL (ref 4.0–10.5)
nRBC: 0 % (ref 0.0–0.2)

## 2021-02-28 LAB — PREGNANCY, URINE: Preg Test, Ur: NEGATIVE

## 2021-02-28 MED ORDER — IRON SUCROSE 20 MG/ML IV SOLN
200.0000 mg | Freq: Once | INTRAVENOUS | Status: AC
  Administered 2021-02-28: 200 mg via INTRAVENOUS
  Filled 2021-02-28: qty 10

## 2021-02-28 MED ORDER — SODIUM CHLORIDE 0.9 % IV SOLN
Freq: Once | INTRAVENOUS | Status: AC
  Filled 2021-02-28: qty 250

## 2021-02-28 MED ORDER — SODIUM CHLORIDE 0.9 % IV SOLN: 200.0000 mg | Freq: Once | INTRAVENOUS | Status: DC

## 2021-02-28 NOTE — Progress Notes (Signed)
[facility] NOTE  Patient Care Team: [name], PA-C as PCP - General (Physician Assistant)  CHIEF COMPLAINTS/PURPOSE OF CONSULTATION: ANEMIA   HEMATOLOGY HISTORY:  # IRON DEF ANEMIA-likely secondary menorrhagia [EGD/colonoscopy/capsule/Bone marrow Biopsy-NONE]; JAN hb 9 [PCP] s/p Venofer.   HISTORY OF PRESENTING ILLNESS:  Christina Ellis 30 y.o.  female with iron deficiency anemia secondary to heavy menstrual cycles is here for follow-up.   Patient is currently status post IV iron infusion; notes that improvement of energy levels.  Interestingly notes to have improvement of her menstrual cycles.  Patient is still awaiting to see gynecology.  Constipation with oral iron.   Review of Systems  Constitutional: Positive for malaise/fatigue. Negative for chills, diaphoresis, fever and weight loss.  HENT: Negative for nosebleeds and sore throat.   Eyes: Negative for double vision.  Respiratory: Negative for cough, hemoptysis, sputum production, shortness of breath and wheezing.   Cardiovascular: Negative for chest pain, palpitations, orthopnea and leg swelling.  Gastrointestinal: Negative for abdominal pain, blood in stool, constipation, diarrhea, heartburn, melena, nausea and vomiting.  Genitourinary: Negative for dysuria, frequency and urgency.  Musculoskeletal: Negative for back pain and joint pain.  Skin: Negative.  Negative for itching and rash.  Neurological: Positive for dizziness. Negative for tingling, focal weakness, weakness and headaches.  Endo/Heme/Allergies: Does not bruise/bleed easily.  Psychiatric/Behavioral: Negative for depression. The patient is not nervous/anxious and does not have insomnia.     MEDICAL HISTORY:  Past Medical History:  Diagnosis Date  . Anemia     SURGICAL HISTORY: History reviewed. No pertinent surgical history.  SOCIAL HISTORY: Social History   Socioeconomic History  . Marital status: Single     Spouse name: Not on file  . Number of children: Not on file  . Years of education: Not on file  . Highest education level: Not on file  Occupational History  . Not on file  Tobacco Use  . Smoking status: Never Smoker  . Smokeless tobacco: Never Used  Vaping Use  . Vaping Use: Never used  Substance and Sexual Activity  . Alcohol use: No    Comment: occasional glass of wine  . Drug use: Not on file  . Sexual activity: Not on file  Other Topics Concern  . 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 Lives in [place]; with mom.    Social Determinants of Health   Financial Resource Strain: Not on file  Food Insecurity: Not on file  Transportation Needs: Not on file  Physical Activity: Not on file  Stress: Not on file  Social Connections: Not on file  Intimate Partner Violence: Not on file    FAMILY HISTORY: Family History  Problem Relation Age of Onset  . Anemia Mother   . Thyroid cancer Maternal Grandmother   . Pancreatic cancer Maternal Grandfather     ALLERGIES:  is allergic to latex.  MEDICATIONS:  Current Outpatient Medications  Medication Sig Dispense Refill  . ferrous sulfate 325 (65 FE) MG tablet Take 1 tablet by mouth daily with breakfast.    . meloxicam (MOBIC) 15 MG tablet Take 1 tablet (15 mg total) by mouth daily. 30 tablet 3  . Multiple Vitamins-Minerals (MULTIVITAMIN ADULT) CHEW Chew by mouth.    . Omega-3 Fatty Acids (FISH OIL) 1000 MG CAPS Take 1 capsule by mouth in the morning and at bedtime.     No current facility-administered medications for this visit.  PHYSICAL EXAMINATION:   Vitals:   02/28/21 1342  BP: 123/85  Pulse: 73  Resp: 18  Temp: (!) 97.2 F (36.2 C)  SpO2: 100%   Filed Weights   02/28/21 1342  Weight: 240 lb (108.9 kg)    Physical Exam Constitutional:      Comments: Ambulating; with mother.   HENT:     Head: Normocephalic and atraumatic.      Mouth/Throat:     Pharynx: No oropharyngeal exudate.  Eyes:     Pupils: Pupils are equal, round, and reactive to light.  Cardiovascular:     Rate and Rhythm: Normal rate and regular rhythm.  Pulmonary:     Effort: Pulmonary effort is normal. No respiratory distress.     Breath sounds: Normal breath sounds. No wheezing.  Abdominal:     General: Bowel sounds are normal. There is no distension.     Palpations: Abdomen is soft. There is no mass.     Tenderness: There is no abdominal tenderness. There is no guarding or rebound.  Musculoskeletal:        General: No tenderness. Normal range of motion.     Cervical back: Normal range of motion and neck supple.  Skin:    General: Skin is warm.  Neurological:     Mental Status: She is alert and oriented to person, place, and time.  Psychiatric:        Mood and Affect: Affect normal.     LABORATORY DATA:  I have reviewed the data as listed Lab Results  Component Value Date   WBC 6.7 02/28/2021   HGB 11.1 (L) 02/28/2021   HCT 35.4 (L) 02/28/2021   MCV 83.9 02/28/2021   PLT 295 02/28/2021   No results for input(s): NA, K, CL, CO2, GLUCOSE, BUN, CREATININE, CALCIUM, GFRNONAA, GFRAA, PROT, ALBUMIN, AST, ALT, ALKPHOS, BILITOT, BILIDIR, IBILI in the last 8760 hours.   DG Foot Complete Right  Result Date: 02/19/2021 Please see detailed radiograph report in office note.   Iron deficiency anemia due to chronic blood loss #Iron deficient anemia likely secondary to chronic blood loss/menorrhagia.  Hb- 9. S/p IV venofer- improved  # Today Hb- 11.2; proceed with IV venofer.   #Menorrhagia-awaiting on GYN evaluation.  # DISPOSITION: # venofer today # follow up in 3 month MD; labs- cbc/urine pregnancy- possible venofer Dr.B   All questions were answered. The patient knows to call the clinic with any problems, questions or concerns.    [name], MD 03/07/2021 7:31 AM

## 2021-02-28 NOTE — Assessment & Plan Note (Addendum)
#  Iron deficient anemia likely secondary to chronic blood loss/menorrhagia.  Hb- 9. S/p IV venofer- improved  # Today Hb- 11.2; proceed with IV venofer.   #Menorrhagia-awaiting on GYN evaluation.  # DISPOSITION: # venofer today # follow up in 3 month MD; labs- cbc/urine pregnancy- possible venofer Dr.B

## 2021-02-28 NOTE — Progress Notes (Signed)
Pt complaint of some constipation with PO iron, has tried miralax.  Period is lighter with infusions.

## 2021-03-19 ENCOUNTER — Ambulatory Visit: Payer: 59 | Admitting: Podiatry

## 2021-03-19 ENCOUNTER — Other Ambulatory Visit: Payer: Self-pay

## 2021-03-19 ENCOUNTER — Encounter: Payer: Self-pay | Admitting: Podiatry

## 2021-03-19 DIAGNOSIS — G5761 Lesion of plantar nerve, right lower limb: Secondary | ICD-10-CM | POA: Diagnosis not present

## 2021-03-19 DIAGNOSIS — G5781 Other specified mononeuropathies of right lower limb: Secondary | ICD-10-CM

## 2021-03-19 NOTE — Progress Notes (Signed)
She presents today for follow-up of her neuroma she states that is probably about 45 to 50% improved.  States that she still has 1 side that seems to bother her more is and on the top of the foot.  States that is particularly tender after she has been working out.  Objective: Vital signs are stable alert oriented x3 there is no erythema edema cellulitis drainage odor still has pain on palpation to the third interdigital space on palp patient.  Palpable Mulder's click is noted.  Assessment: Neuroma third interdigital space 45% resolved.  Plan: At this point after discussing pros and cons we decided to start with her first dose of dehydrated alcohol we injected 1 and half cc of dehydrated alcohol with local anesthetic today and she tolerated procedure well I will follow-up with her in 1 month for her second dose.

## 2021-04-16 ENCOUNTER — Ambulatory Visit: Payer: 59 | Admitting: Podiatry

## 2021-04-16 ENCOUNTER — Encounter: Payer: Self-pay | Admitting: Podiatry

## 2021-04-16 ENCOUNTER — Other Ambulatory Visit: Payer: Self-pay

## 2021-04-16 DIAGNOSIS — G5761 Lesion of plantar nerve, right lower limb: Secondary | ICD-10-CM | POA: Diagnosis not present

## 2021-04-16 DIAGNOSIS — G5781 Other specified mononeuropathies of right lower limb: Secondary | ICD-10-CM

## 2021-04-16 NOTE — Progress Notes (Signed)
She presents today after her first dose of dehydrated alcohol third interdigital space of the right foot.  She states that it is a lot better maybe only need 1 more.  She states that she is approximately 85% improved.  Objective: Vital signs are stable she is alert and oriented x3.  There is no erythema edema/drainage odor still has some tenderness on palpation third interspace of the right foot.  Assessment: Resolving neuroma third interspace right foot.  Plan: Injected her second dose of dehydrated alcohol into the third interdigital space right foot.  After sterile alcohol skin prep.  She tolerated procedure well we will follow-up with her in 3 to 4 weeks.

## 2021-05-06 ENCOUNTER — Ambulatory Visit: Payer: 59 | Admitting: Obstetrics and Gynecology

## 2021-05-06 ENCOUNTER — Other Ambulatory Visit: Payer: Self-pay

## 2021-05-06 ENCOUNTER — Encounter: Payer: Self-pay | Admitting: Obstetrics and Gynecology

## 2021-05-06 ENCOUNTER — Other Ambulatory Visit (HOSPITAL_COMMUNITY)
Admission: RE | Admit: 2021-05-06 | Discharge: 2021-05-06 | Disposition: A | Discharge status: Home / Self Care | Payer: 59 | Source: Ambulatory Visit | Attending: Obstetrics and Gynecology | Admitting: Obstetrics and Gynecology

## 2021-05-06 VITALS — BP 122/82 | HR 73 | Ht 67.0 in | Wt 239.0 lb

## 2021-05-06 DIAGNOSIS — N920 Excessive and frequent menstruation with regular cycle: Secondary | ICD-10-CM

## 2021-05-06 DIAGNOSIS — D500 Iron deficiency anemia secondary to blood loss (chronic): Secondary | ICD-10-CM

## 2021-05-06 DIAGNOSIS — Z124 Encounter for screening for malignant neoplasm of cervix: Secondary | ICD-10-CM | POA: Insufficient documentation

## 2021-05-06 DIAGNOSIS — Z113 Encounter for screening for infections with a predominantly sexual mode of transmission: Secondary | ICD-10-CM | POA: Insufficient documentation

## 2021-05-06 NOTE — Progress Notes (Signed)
Obstetrics & Gynecology Office Visit   Chief Complaint  Patient presents with   Referral    [facility] referral for heavy menses   The patient is seen in referral at the request of Charlaine Dalton, MD, from [facility] for heavy menses and resultant iron deficiency anemia.   History of Present Illness: 30 y.o. G0P0000 female who is seen in referral from Charlaine Dalton, MD, from [facility] for heavy menses and resultant iron deficiency anemia.  She has been having heavy periods since menarche, which was around age 47-12.  Her periods come irregularly. More recently her menses didn't last as long. Usually her periods can last from 4-7 days.  They come monthly.  She passes clots, small ones.  The first couple of days are without clots, then she notes clots, but lighter overall flow.  She has moderate pain with her periods.  She states that she is having anal cramps with her periods.  She has a hard time have a bowel movement.  This is uncomfortable for her. She has never taken any medication to control her periods.  She is not sexually active.  She has never had an STD. She is unsure whether she has had a pap smear before.   Past Medical History:  Diagnosis Date   Anemia    Past Surgical History:  Procedure Laterality Date   NO PAST SURGERIES     Gynecologic History: Patient's last menstrual period was 04/08/2021 (approximate).  Obstetric History: G0P0000  Family History  Problem Relation Age of Onset   Anemia Mother    Thyroid cancer Maternal Grandmother    Pancreatic cancer Maternal Grandfather     Social History   Socioeconomic History   Marital status: Single    Spouse name: Not on file   Number of children: Not on file   Years of education: Not on file   Highest education level: Not on file  Occupational History   Not on file  Tobacco Use   Smoking status: Never   Smokeless tobacco: Never  Vaping Use   Vaping Use: Never used  Substance and  Sexual Activity   Alcohol use: No    Comment: occasional glass of wine   Drug use: Never   Sexual activity: Not Currently    Birth control/protection: None  Other Topics Concern   Not on file  Social History Narrative   Emergency dept; 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 Lives in [place]; with mom.    Social Determinants of Health   Financial Resource Strain: Not on file  Food Insecurity: Not on file  Transportation Needs: Not on file  Physical Activity: Not on file  Stress: Not on file  Social Connections: Not on file  Intimate Partner Violence: Not on file    Allergies  Allergen Reactions   Latex Itching    Prior to Admission medications   Medication Sig Start Date End Date Taking? Authorizing Provider  meloxicam (MOBIC) 15 MG tablet Take 1 tablet (15 mg total) by mouth daily. 02/19/21  Yes Hyatt, Max T, DPM  Multiple Vitamins-Minerals (MULTIVITAMIN ADULT) CHEW Chew by mouth.   Yes [provider]  ferrous sulfate 325 (65 FE) MG tablet Take 1 tablet by mouth daily with breakfast. 03/05/20 03/05/21  [provider]   Review of Systems  Constitutional: Negative.   HENT: Negative.    Eyes: Negative.   Respiratory: Negative.    Cardiovascular: Negative.   Gastrointestinal: Negative.   Genitourinary: Negative.  Musculoskeletal: Negative.   Skin: Negative.   Neurological: Negative.   Psychiatric/Behavioral: Negative.      Physical Exam BP 122/82 (Cuff Size: Normal)   Pulse 73   Ht 5\' 7"  (1.702 m)   Wt 239 lb (108.4 kg)   LMP 04/08/2021 (Approximate)   BMI 37.43 kg/m  Patient's last menstrual period was 04/08/2021 (approximate). Physical Exam Constitutional:      General: She is not in acute distress.    Appearance: Normal appearance. She is well-developed.  Genitourinary:     Vulva, bladder and urethral meatus normal.     No lesions in the vagina.     Right Labia: No rash, tenderness, lesions, skin changes or Bartholin's cyst.     Left Labia: No tenderness, lesions, skin changes, Bartholin's cyst or rash.    No inguinal adenopathy present in the right or left side.    Pelvic Tanner Score: 5/5.    No vaginal discharge, erythema, tenderness or bleeding.      Right Adnexa: not tender, not full and no mass present.    Left Adnexa: not tender, not full and no mass present.    No cervical motion tenderness, friability, lesion or polyp.     Uterus is enlarged and irregular.     Uterus is not fixed or tender.     Uterine mass (mass extending near midline up to about halfway between upper pubic symphysis and ubmilicus. mobile, nontender) present.    Uterus is anteverted.     No urethral tenderness or mass present.     Pelvic exam was performed with patient in the lithotomy position.  HENT:     Head: Normocephalic and atraumatic.  Eyes:     General: No scleral icterus.    Conjunctiva/sclera: Conjunctivae normal.  Cardiovascular:     Rate and Rhythm: Normal rate and regular rhythm.     Heart sounds: No murmur heard.   No friction rub. No gallop.  Pulmonary:     Effort: Pulmonary effort is normal. No respiratory distress.     Breath sounds: Normal breath sounds. No wheezing or rales.  Abdominal:     General: Bowel sounds are normal. There is no distension.     Palpations: Abdomen is soft. There is mass (difficult to assess given stomach musculature (See Gyn portion of exam)).     Tenderness: There is no abdominal tenderness. There is no guarding or rebound.     Hernia: There is no hernia in the left inguinal area or right inguinal area.  Musculoskeletal:        General: Normal range of motion.     Cervical back: Normal range of motion and neck supple.  Lymphadenopathy:     Lower Body: No right inguinal adenopathy. No left inguinal adenopathy.  Neurological:     General: No focal deficit present.     Mental Status: She is alert and oriented to person, place, and time.     Cranial Nerves: No cranial nerve deficit.   Skin:    General: Skin is warm and dry.     Findings: No erythema.  Psychiatric:        Mood and Affect: Mood normal.        Behavior: Behavior normal.        Judgment: Judgment normal.    Female chaperone present for pelvic and breast  portions of the physical exam  Assessment: 30 y.o. G0P0000 female here for  1. Menorrhagia with regular cycle   2. Screen for  STD (sexually transmitted disease)   3. Pap smear for cervical cancer screening   4. Iron deficiency anemia due to chronic blood loss      Plan: Problem List Items Addressed This Visit       Other   Iron deficiency anemia due to chronic blood loss   Relevant Orders   US PELVIC COMPLETE WITH TRANSVAGINAL   Other Visit Diagnoses     Menorrhagia with regular cycle    -  Primary   Relevant Orders   Cytology - PAP   TSH   US PELVIC COMPLETE WITH TRANSVAGINAL   Screen for STD (sexually transmitted disease)       Relevant Orders   Cytology - PAP   Pap smear for cervical cancer screening       Relevant Orders   Cytology - PAP      Discussed management options for abnormal uterine bleeding including expectant, NSAIDs, tranexamic acid (Lysteda), oral progesterone (Provera, norethindrone, megace), Depo Provera, Levonorgestrel containing IUD, endometrial ablation (Novasure) or hysterectomy as definitive surgical management.  Discussed risks and benefits of each method.   Final management decision will hinge on results of patient's work up and whether an underlying etiology for the patients bleeding symptoms can be discerned.  We will conduct a basic work up examining using the PALM-COIEN classification system.  In the meantime the patient opts to trial no medication while we await results of her ultrasound and labs.  Bleeding precautions reviewed.  Suspect fibroid.  Will assess location and nature of lesion palpated.   TSH today. Will check pap smear and screen for STI.   A total of 38 minutes were spent face-to-face with  the patient as well as preparation, review, communication, and documentation during this encounter.    Prentice Docker, MD 05/06/2021 10:12 AM    CC: Charlaine Dalton, MD [place], [place], [place], [place] [address] [place],   [id number]

## 2021-05-07 LAB — TSH: TSH: 1.73 u[IU]/mL (ref 0.450–4.500)

## 2021-05-08 LAB — CYTOLOGY - PAP
Chlamydia: NEGATIVE
Comment: NEGATIVE
Comment: NEGATIVE
Comment: NORMAL
Diagnosis: NEGATIVE
Neisseria Gonorrhea: NEGATIVE
Trichomonas: NEGATIVE

## 2021-05-30 ENCOUNTER — Inpatient Hospital Stay (HOSPITAL_BASED_OUTPATIENT_CLINIC_OR_DEPARTMENT_OTHER): Payer: 59 | Admitting: Internal Medicine

## 2021-05-30 ENCOUNTER — Inpatient Hospital Stay: Payer: 59

## 2021-05-30 ENCOUNTER — Inpatient Hospital Stay: Payer: 59 | Attending: Internal Medicine

## 2021-05-30 ENCOUNTER — Other Ambulatory Visit: Payer: Self-pay

## 2021-05-30 ENCOUNTER — Encounter: Payer: Self-pay | Admitting: Internal Medicine

## 2021-05-30 VITALS — BP 114/76 | HR 70

## 2021-05-30 DIAGNOSIS — D500 Iron deficiency anemia secondary to blood loss (chronic): Secondary | ICD-10-CM | POA: Insufficient documentation

## 2021-05-30 DIAGNOSIS — N920 Excessive and frequent menstruation with regular cycle: Secondary | ICD-10-CM | POA: Insufficient documentation

## 2021-05-30 DIAGNOSIS — Z79899 Other long term (current) drug therapy: Secondary | ICD-10-CM | POA: Insufficient documentation

## 2021-05-30 LAB — CBC WITH DIFFERENTIAL/PLATELET
Abs Immature Granulocytes: 0.01 10*3/uL (ref 0.00–0.07)
Basophils Absolute: 0 10*3/uL (ref 0.0–0.1)
Basophils Relative: 1 %
Eosinophils Absolute: 0.1 10*3/uL (ref 0.0–0.5)
Eosinophils Relative: 2 %
HCT: 34.1 % — ABNORMAL LOW (ref 36.0–46.0)
Hemoglobin: 10.9 g/dL — ABNORMAL LOW (ref 12.0–15.0)
Immature Granulocytes: 0 %
Lymphocytes Relative: 46 %
Lymphs Abs: 2.5 10*3/uL (ref 0.7–4.0)
MCH: 27.6 pg (ref 26.0–34.0)
MCHC: 32 g/dL (ref 30.0–36.0)
MCV: 86.3 fL (ref 80.0–100.0)
Monocytes Absolute: 0.4 10*3/uL (ref 0.1–1.0)
Monocytes Relative: 7 %
Neutro Abs: 2.4 10*3/uL (ref 1.7–7.7)
Neutrophils Relative %: 44 %
Platelets: 327 10*3/uL (ref 150–400)
RBC: 3.95 MIL/uL (ref 3.87–5.11)
RDW: 13.4 % (ref 11.5–15.5)
WBC: 5.4 10*3/uL (ref 4.0–10.5)
nRBC: 0 % (ref 0.0–0.2)

## 2021-05-30 LAB — PREGNANCY, URINE: Preg Test, Ur: NEGATIVE

## 2021-05-30 MED ORDER — IRON SUCROSE 20 MG/ML IV SOLN
200.0000 mg | Freq: Once | INTRAVENOUS | Status: AC
  Administered 2021-05-30: 200 mg via INTRAVENOUS
  Filled 2021-05-30: qty 10

## 2021-05-30 MED ORDER — SODIUM CHLORIDE 0.9 % IV SOLN: 200.0000 mg | Freq: Once | INTRAVENOUS | Status: DC

## 2021-05-30 MED ORDER — SODIUM CHLORIDE 0.9 % IV SOLN
Freq: Once | INTRAVENOUS | Status: AC
  Filled 2021-05-30: qty 250

## 2021-05-30 NOTE — Patient Instructions (Signed)
[facility]  Discharge Instructions: Thank you for choosing [facility] to provide your oncology and hematology care.  If you have a lab appointment with the Cancer Center, please go directly to the Cancer Center and check in at the registration area.  Wear comfortable clothing and clothing appropriate for easy access to any Portacath or PICC line.   We strive to give you quality time with your provider. You may need to reschedule your appointment if you arrive late (15 or more minutes).  Arriving late affects you and other patients whose appointments are after yours.  Also, if you miss three or more appointments without notifying the office, you may be dismissed from the clinic at the provider's discretion.      For prescription refill requests, have your pharmacy contact our office and allow 72 hours for refills to be completed.    Today you received Venofer   To help prevent nausea and vomiting after your treatment, we encourage you to take your nausea medication as directed.  BELOW ARE SYMPTOMS THAT SHOULD BE REPORTED IMMEDIATELY: . *FEVER GREATER THAN 100.4 F (38 C) OR HIGHER . *CHILLS OR SWEATING . *NAUSEA AND VOMITING THAT IS NOT CONTROLLED WITH YOUR NAUSEA MEDICATION . *UNUSUAL SHORTNESS OF BREATH . *UNUSUAL BRUISING OR BLEEDING . *URINARY PROBLEMS (pain or burning when urinating, or frequent urination) . *BOWEL PROBLEMS (unusual diarrhea, constipation, pain near the anus) . TENDERNESS IN MOUTH AND THROAT WITH OR WITHOUT PRESENCE OF ULCERS (sore throat, sores in mouth, or a toothache) . UNUSUAL RASH, SWELLING OR PAIN  . UNUSUAL VAGINAL DISCHARGE OR ITCHING   Items with * indicate a potential emergency and should be followed up as soon as possible or go to the Emergency Department if any problems should occur.  Please show the CHEMOTHERAPY ALERT CARD or IMMUNOTHERAPY ALERT CARD at check-in to the Emergency Department and triage  nurse.  Should you have questions after your visit or need to cancel or reschedule your appointment, please contact [facility]  [phone] and follow the prompts.  Office hours are 8:00 a.m. to 4:30 p.m. Monday - Friday. Please note that voicemails left after 4:00 p.m. may not be returned until the following business day.  We are closed weekends and major holidays. You have access to a nurse at all times for urgent questions. Please call the main number to the clinic [phone] and follow the prompts.  For any non-urgent questions, you may also contact your provider using MyChart. We now offer e-Visits for anyone 18 and older to request care online for non-urgent symptoms. For details visit [email or web address].   Also download the MyChart app! Go to the app store, search "MyChart", open the app, select [facility], and log in with your MyChart username and password.  Due to Covid, a mask is required upon entering the hospital/clinic. If you do not have a mask, one will be given to you upon arrival. For doctor visits, patients may have 1 support person aged 18 or older with them. For treatment visits, patients cannot have anyone with them due to current Covid guidelines and our immunocompromised population.  

## 2021-05-30 NOTE — Progress Notes (Signed)
[facility] NOTE  Patient Care Team: [name], PA-C as PCP - General (Physician Assistant)  CHIEF COMPLAINTS/PURPOSE OF CONSULTATION: ANEMIA   HEMATOLOGY HISTORY:  # IRON DEF ANEMIA-likely secondary menorrhagia [EGD/colonoscopy/capsule/Bone marrow Biopsy-NONE]; JAN hb 9 [PCP] s/p Venofer.   #Gynecology-June 09, 2021 ultrasound pending-?  Fibroids  HISTORY OF PRESENTING ILLNESS:  Christina Ellis 30 y.o.  female with iron deficiency anemia secondary to heavy menstrual cycles is here for follow-up.  S/p evaluation with gynecology-question fibroids awaiting ultrasound August 1.  Patient notes to have improvement of energy after iron infusion.  Her energy levels are slightly low today.  Patient is currently status post IV iron infusion; notes that improvement of energy levels.  Patient actively trying to lose weight.   Review of Systems  Constitutional:  Positive for malaise/fatigue. Negative for chills, diaphoresis, fever and weight loss.  HENT:  Negative for nosebleeds and sore throat.   Eyes:  Negative for double vision.  Respiratory:  Negative for cough, hemoptysis, sputum production, shortness of breath and wheezing.   Cardiovascular:  Negative for chest pain, palpitations, orthopnea and leg swelling.  Gastrointestinal:  Negative for abdominal pain, blood in stool, constipation, diarrhea, heartburn, melena, nausea and vomiting.  Genitourinary:  Negative for dysuria, frequency and urgency.  Musculoskeletal:  Negative for back pain and joint pain.  Skin: Negative.  Negative for itching and rash.  Neurological:  Positive for dizziness. Negative for tingling, focal weakness, weakness and headaches.  Endo/Heme/Allergies:  Does not bruise/bleed easily.  Psychiatric/Behavioral:  Negative for depression. The patient is not nervous/anxious and does not have insomnia.    MEDICAL HISTORY:  Past Medical History:  Diagnosis Date   Anemia      SURGICAL HISTORY: Past Surgical History:  Procedure Laterality Date   NO PAST SURGERIES      SOCIAL HISTORY: Social History   Socioeconomic History   Marital status: Single    Spouse name: Not on file   Number of children: Not on file   Years of education: Not on file   Highest education level: Not on file  Occupational History   Not on file  Tobacco Use   Smoking status: Never   Smokeless tobacco: Never  Vaping Use   Vaping Use: Never used  Substance and Sexual Activity   Alcohol use: No    Comment: occasional glass of wine   Drug use: Never   Sexual activity: Not Currently    Birth control/protection: None  Other Topics Concern   Not on file  Social History Narrative   Emergency dept; 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    Social Determinants of Health   Financial Resource Strain: Not on file  Food Insecurity: Not on file  Transportation Needs: Not on file  Physical Activity: Not on file  Stress: Not on file  Social Connections: Not on file  Intimate Partner Violence: Not on file    FAMILY HISTORY: Family History  Problem Relation Age of Onset   Anemia Mother    Thyroid cancer Maternal Grandmother    Pancreatic cancer Maternal Grandfather     ALLERGIES:  is allergic to latex.  MEDICATIONS:  Current Outpatient Medications  Medication Sig Dispense Refill   ferrous sulfate 325 (65 FE) MG tablet Take 1 tablet by mouth daily with breakfast.     Omega-3 Fatty Acids (FISH OIL) 1000 MG CAPS Take 1 capsule by mouth in the morning and at bedtime.  Multiple Vitamins-Minerals (MULTIVITAMIN ADULT) CHEW Chew by mouth. (Patient not taking: Reported on 05/30/2021)     No current facility-administered medications for this visit.      PHYSICAL EXAMINATION:   Vitals:   05/30/21 1321  BP: 124/82  Pulse: 78  Resp: 16  Temp: 98.3 F (36.8 C)  SpO2: 99%   Filed Weights   05/30/21 1321  Weight: 234 lb (106.1 kg)     Physical Exam Constitutional:      Comments: Ambulating; with mother.   HENT:     Head: Normocephalic and atraumatic.     Mouth/Throat:     Pharynx: No oropharyngeal exudate.  Eyes:     Pupils: Pupils are equal, round, and reactive to light.  Cardiovascular:     Rate and Rhythm: Normal rate and regular rhythm.  Pulmonary:     Effort: Pulmonary effort is normal. No respiratory distress.     Breath sounds: Normal breath sounds. No wheezing.  Abdominal:     General: Bowel sounds are normal. There is no distension.     Palpations: Abdomen is soft. There is no mass.     Tenderness: no abdominal tenderness There is no guarding or rebound.  Musculoskeletal:        General: No tenderness. Normal range of motion.     Cervical back: Normal range of motion and neck supple.  Skin:    General: Skin is warm.  Neurological:     Mental Status: She is alert and oriented to person, place, and time.  Psychiatric:        Mood and Affect: Affect normal.    LABORATORY DATA:  I have reviewed the data as listed Lab Results  Component Value Date   WBC 5.4 05/30/2021   HGB 10.9 (L) 05/30/2021   HCT 34.1 (L) 05/30/2021   MCV 86.3 05/30/2021   PLT 327 05/30/2021   No results for input(s): NA, K, CL, CO2, GLUCOSE, BUN, CREATININE, CALCIUM, GFRNONAA, GFRAA, PROT, ALBUMIN, AST, ALT, ALKPHOS, BILITOT, BILIDIR, IBILI in the last 8760 hours.   No results found.   Iron deficiency anemia due to chronic blood loss #Iron deficient anemia likely secondary to chronic blood loss/menorrhagia.  Hb- 9. S/p IV venofer- improved  # Today Hb- 10.9; overall stable proceed with IV venofer.   #Menorrhagia [uerine ]-s/p GYN evaluation- [place] aug 1st-2022  # weight loss: Congratulated patient-discussed regarding cutting down processed foods.  # DISPOSITION: # venofer today # follow up in 3 month MD; labs- cbc/urine pregnancy- possible venofer Dr.B   All questions were answered. The patient knows to call  the clinic with any problems, questions or concerns.    [name], MD 05/30/2021 2:02 PM

## 2021-05-30 NOTE — Assessment & Plan Note (Addendum)
#  Iron deficient anemia likely secondary to chronic blood loss/menorrhagia.  Hb- 9. S/p IV venofer- improved  # Today Hb- 10.9; overall stable proceed with IV venofer.   #Menorrhagia [uerine ]-s/p GYN evaluation- [place] aug 1st-2022  # weight loss: Congratulated patient-discussed regarding cutting down processed foods.  # DISPOSITION: # venofer today # follow up in 3 month MD; labs- cbc/urine pregnancy- possible venofer Dr.B

## 2021-06-04 ENCOUNTER — Ambulatory Visit: Payer: 59 | Admitting: Podiatry

## 2021-06-09 ENCOUNTER — Other Ambulatory Visit: Payer: Self-pay

## 2021-06-09 ENCOUNTER — Ambulatory Visit
Admission: RE | Admit: 2021-06-09 | Discharge: 2021-06-09 | Disposition: A | Discharge status: Home / Self Care | Payer: 59 | Source: Ambulatory Visit | Attending: Obstetrics and Gynecology | Admitting: Obstetrics and Gynecology

## 2021-06-09 DIAGNOSIS — D259 Leiomyoma of uterus, unspecified: Secondary | ICD-10-CM | POA: Diagnosis not present

## 2021-06-09 DIAGNOSIS — N920 Excessive and frequent menstruation with regular cycle: Secondary | ICD-10-CM | POA: Diagnosis not present

## 2021-06-09 DIAGNOSIS — D500 Iron deficiency anemia secondary to blood loss (chronic): Secondary | ICD-10-CM | POA: Insufficient documentation

## 2021-06-12 ENCOUNTER — Encounter: Payer: Self-pay | Admitting: Obstetrics and Gynecology

## 2021-06-12 ENCOUNTER — Ambulatory Visit (INDEPENDENT_AMBULATORY_CARE_PROVIDER_SITE_OTHER): Payer: 59 | Admitting: Obstetrics and Gynecology

## 2021-06-12 ENCOUNTER — Other Ambulatory Visit: Payer: Self-pay

## 2021-06-12 VITALS — BP 130/72 | HR 93 | Ht 67.0 in | Wt 238.0 lb

## 2021-06-12 DIAGNOSIS — D250 Submucous leiomyoma of uterus: Secondary | ICD-10-CM | POA: Diagnosis not present

## 2021-06-12 DIAGNOSIS — D252 Subserosal leiomyoma of uterus: Secondary | ICD-10-CM | POA: Diagnosis not present

## 2021-06-12 DIAGNOSIS — N920 Excessive and frequent menstruation with regular cycle: Secondary | ICD-10-CM

## 2021-06-12 DIAGNOSIS — D251 Intramural leiomyoma of uterus: Secondary | ICD-10-CM

## 2021-06-12 NOTE — Progress Notes (Signed)
Gynecology Ultrasound Follow Up  Chief Complaint:  Chief Complaint  Patient presents with   Follow-up    U/S 06/09/21 for AUB     History of Present Illness: Patient is a 30 y.o. female who presents today for ultrasound evaluation of heavy menses with resultant anemia and suspicion for uterine fibroids.  Marland Kitchen  Ultrasound demonstrates the following findings Adnexa: no masses seen  Uterus: endometrial stripe  12.0 mm Additional: multiple uterine masses: Right posterior mid uterine mass measures 3.7 x 3.1 x 4.7 cm.  Submucosal mid posterior uterine mass measuring 2.7 x 2.5 x 2.7 cm.  Subserosal fundal mass measuring 2.7 x 3 x 3.6 cm  She has not had a period since her last visit.   Past Medical History:  Diagnosis Date   Anemia     Past Surgical History:  Procedure Laterality Date   NO PAST SURGERIES      Family History  Problem Relation Age of Onset   Anemia Mother    Thyroid cancer Maternal Grandmother    Pancreatic cancer Maternal Grandfather     Social History   Socioeconomic History   Marital status: Single    Spouse name: Not on file   Number of children: Not on file   Years of education: Not on file   Highest education level: Not on file  Occupational History   Not on file  Tobacco Use   Smoking status: Never   Smokeless tobacco: Never  Vaping Use   Vaping Use: Never used  Substance and Sexual Activity   Alcohol use: No    Comment: occasional glass of wine   Drug use: Never   Sexual activity: Not Currently    Birth control/protection: None  Other Topics Concern   Not on file  Social History Narrative   Emergency dept; 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    Social Determinants of Health   Financial Resource Strain: Not on file  Food Insecurity: Not on file  Transportation Needs: Not on file  Physical Activity: Not on file  Stress: Not on file  Social Connections: Not on file  Intimate Partner Violence:  Not on file    Allergies  Allergen Reactions   Latex Itching    Prior to Admission medications   Medication Sig Start Date End Date Taking? Authorizing Provider  ferrous sulfate 325 (65 FE) MG tablet Take 1 tablet by mouth daily with breakfast. 03/05/20 05/30/21  [provider]  Multiple Vitamins-Minerals (MULTIVITAMIN ADULT) CHEW Chew by mouth. Patient not taking: No sig reported    [provider]  Omega-3 Fatty Acids (FISH OIL) 1000 MG CAPS Take 1 capsule by mouth in the morning and at bedtime. Patient not taking: Reported on 06/12/2021    [provider]    Physical Exam BP 130/72 (Cuff Size: Large)   Pulse 93   Ht '5\' 7"'$  (1.702 m)   Wt 238 lb (108 kg)   LMP 05/08/2021 (Exact Date)   BMI 37.28 kg/m    General: NAD HEENT: normocephalic, anicteric Pulmonary: No increased work of breathing Extremities: no edema, erythema, or tenderness Neurologic: Grossly intact, normal gait Psychiatric: mood appropriate, affect full  Imaging Results US PELVIC COMPLETE WITH TRANSVAGINAL  Result Date: 06/10/2021 CLINICAL DATA:  Menorrhagia EXAM: TRANSABDOMINAL AND TRANSVAGINAL ULTRASOUND OF PELVIS TECHNIQUE: Both transabdominal and transvaginal ultrasound examinations of the pelvis were performed. Transabdominal technique was performed for global imaging of the pelvis including uterus, ovaries, adnexal regions, and  pelvic cul-de-sac. It was necessary to proceed with endovaginal exam following the transabdominal exam to visualize the uterus endometrium ovaries. COMPARISON:  None FINDINGS: Uterus Measurements: 12.5 x 6.5 x 7.3 cm = volume: 310.3 mL. Multiple uterine masses. Right posterior mid uterine mass measures 3.7 x 3.1 x 4.7 cm. Submucosal mid posterior uterine mass measuring 2.7 x 2.5 x 2.7 cm. Subserosal fundal mass measuring 2.7 x 3 x 3.6 cm Endometrium Thickness: 12 mm.  No focal abnormality visualized. Right ovary Measurements: 5.7 x 3 x 4.1 cm = volume: 36.6 mL.  Normal appearance/no adnexal mass. Left ovary Measurements: 4.3 x 1.6 x 3.1 cm = volume: 11.6 mL. Normal appearance/no adnexal mass. Other findings No abnormal free fluid. IMPRESSION: 1. Multiple uterine fibroids 2. Normal premenopausal endometrial thickness Electronically Signed   By: Donavan Foil M.D.   On: 06/10/2021 19:18     Assessment: 30 y.o. G0P0000  1. Menorrhagia with regular cycle   2. Intramural, submucous, and subserous leiomyoma of uterus      Plan: Problem List Items Addressed This Visit   None Visit Diagnoses     Menorrhagia with regular cycle    -  Primary   Intramural, submucous, and subserous leiomyoma of uterus          Discussed the findings with her on ultrasound.  She would like to retain the ability to have children.  discussed hysteroscopy with dilation and curettage and submucosal myomectomy. She will consider.  Discussed this approach to remove the submucosal component of one of her fibroids and any other I might find. This may also benefit her from a reproductive standpoint.    We discussed an alternative to surgery in the form of hormonal manipulation.  Discussed oral contraceptives to help lower her amount of bleeding.   We discussed also that she could do nothing at this time.    She'll consider and let me know.  A total of 24 minutes were spent face-to-face with the patient as well as preparation, review, communication, and documentation during this encounter.    Prentice Docker, MD, [facility], [facility] 06/12/2021 11:36 AM

## 2021-06-30 ENCOUNTER — Telehealth: Payer: Self-pay

## 2021-06-30 NOTE — Telephone Encounter (Signed)
Called patient to schedule hysteroscopy d&c with Myomectomy using Myosure w Kendra Opitz 10/4  H&P 9/27 @ 0810   Pre-admit phone call appointment to be requested - date and time will be included on H&P paper work. Also all appointments will be updated on pt MyChart. Explained that this appointment has a call window. Based on the time scheduled will indicate if the call will be received within a 4 hour window before 1:00 or after.  Advised that pt may also receive calls from the hospital pharmacy and pre-service center.  Confirmed pt has [employer] as primary insurance. No secondary insurance.

## 2021-06-30 NOTE — Telephone Encounter (Signed)
-----   Message from Will Bonnet, MD sent at 06/24/2021  7:00 PM EDT ----- Regarding: Schedule surgery Surgery Booking Request Patient Full Name:  Christina Ellis  MRN: [id number]  DOB: 1991/02/08  Surgeon: Prentice Docker, MD  Requested Surgery Date and Time: TBD Primary Diagnosis AND Code:  1) Menorrhagia with regular cycle [N92.0] 2) Fibroid uterus [D25.0, D25.1, D25.2] Secondary Diagnosis and Code:  Surgical Procedure: Hysteroscopy, dilation and curettage, submucosal myomectomy RNFA Requested?: No L&D Notification: No Admission Status: same day surgery Length of Surgery: 50 min Special Case Needs: yes, MyoSure H&P: Yes Phone Interview???:  Yes Interpreter: No Medical Clearance:  No Special Scheduling Instructions: No Any known health/anesthesia issues, diabetes, sleep apnea, latex allergy, defibrillator/pacemaker?: latex allergy Acuity: P3   (P1 highest, P2 delay may cause harm, P3 low, elective gyn, P4 lowest) Post op follow up visits: 2 weeks post-op

## 2021-08-05 ENCOUNTER — Other Ambulatory Visit: Payer: Self-pay

## 2021-08-05 ENCOUNTER — Ambulatory Visit (INDEPENDENT_AMBULATORY_CARE_PROVIDER_SITE_OTHER): Payer: 59 | Admitting: Obstetrics and Gynecology

## 2021-08-05 ENCOUNTER — Encounter: Payer: Self-pay | Admitting: Obstetrics and Gynecology

## 2021-08-05 VITALS — BP 122/80 | Ht 67.0 in | Wt 245.0 lb

## 2021-08-05 DIAGNOSIS — D252 Subserosal leiomyoma of uterus: Secondary | ICD-10-CM

## 2021-08-05 DIAGNOSIS — D251 Intramural leiomyoma of uterus: Secondary | ICD-10-CM

## 2021-08-05 DIAGNOSIS — N920 Excessive and frequent menstruation with regular cycle: Secondary | ICD-10-CM

## 2021-08-05 DIAGNOSIS — D250 Submucous leiomyoma of uterus: Secondary | ICD-10-CM

## 2021-08-05 NOTE — Progress Notes (Signed)
Preoperative History and Physical  Christina Ellis is a 30 y.o. G0P0000 here for surgical management of menorrhagia with regular cycle and intramural, submucous, and subserous fibroids of the uterus.   No significant preoperative concerns.  History of Present Illness:  30 y.o. G0P0000 female who has heavy menses and resultant iron deficiency anemia.  She has been having heavy periods since menarche, which was around age 25-12.  Her periods come irregularly. More recently her menses didn't last as long. Usually her periods can last from 4-7 days.  They come monthly.  She passes clots, small ones.  The first couple of days are without clots, then she notes clots, but lighter overall flow.  She has moderate pain with her periods.  She states that she is having anal cramps with her periods.  She has a hard time have a bowel movement.  This is uncomfortable for her. She has never taken any medication to control her periods.  She is not sexually active.  She has never had an STD. She is unsure whether she has had a pap smear before.  Pelvic ultrasound (from report on 06/09/21):  Ultrasound demonstrates the following findings Adnexa: no masses seen  Uterus: endometrial stripe  12.0 mm Additional: multiple uterine masses: Right posterior mid uterine mass measures 3.7 x 3.1 x 4.7 cm.  Submucosal mid posterior uterine mass measuring 2.7 x 2.5 x 2.7 cm.  Subserosal fundal mass measuring 2.7 x 3 x 3.6 cm  Pap smear: negative, STI screen negative  From most recent CBC: hemoglobin 10.9, hematocrit 34.1. TSH 1.730  Proposed surgery: Hysteroscopy with dilation and curettage, submucosal myomectomy  Past Medical History:  Diagnosis Date   Anemia    Past Surgical History:  Procedure Laterality Date   NO PAST SURGERIES     OB History  Gravida Para Term Preterm AB Living  0 0 0 0 0 0  SAB IAB Ectopic Multiple Live Births  0 0 0 0 0  Patient denies any other pertinent gynecologic issues.   Current  Outpatient Medications on File Prior to Visit  Medication Sig Dispense Refill   acetaminophen (TYLENOL) 500 MG tablet Take 1,000 mg by mouth every 6 (six) hours as needed for moderate pain.     Acetaminophen-Caff-Pyrilamine (MIDOL COMPLETE PO) Take 2 tablets by mouth every 4 (four) hours as needed (pain).     ferrous sulfate 325 (65 FE) MG tablet Take 1 tablet by mouth daily with breakfast.     Omega 3-6-9 Fatty Acids (OMEGA-3 FUSION) LIQD Take 5 mLs by mouth daily.     No current facility-administered medications on file prior to visit.   Allergies  Allergen Reactions   Latex Itching    Social History:   reports that she has never smoked. She has never used smokeless tobacco. She reports that she does not drink alcohol and does not use drugs.  Family History  Problem Relation Age of Onset   Anemia Mother    Thyroid cancer Maternal Grandmother    Pancreatic cancer Maternal Grandfather     Review of Systems: Noncontributory  PHYSICAL EXAM: Blood pressure 122/80, height 5\' 7"  (1.702 m), weight 245 lb (111.1 kg). CONSTITUTIONAL: Well-developed, well-nourished female in no acute distress.  HENT:  Normocephalic, atraumatic, External right and left ear normal. Oropharynx is clear and moist EYES: Conjunctivae and EOM are normal. Pupils are equal, round, and reactive to light. No scleral icterus.  NECK: Normal range of motion, supple, no masses SKIN: Skin is warm and  dry. No rash noted. Not diaphoretic. No erythema. No pallor. [place]: Alert and oriented to person, place, and time. Normal reflexes, muscle tone coordination. No cranial nerve deficit noted. PSYCHIATRIC: Normal mood and affect. Normal behavior. Normal judgment and thought content. CARDIOVASCULAR: Normal heart rate noted, regular rhythm RESPIRATORY: Effort and breath sounds normal, no problems with respiration noted ABDOMEN: Soft, nontender, nondistended. PELVIC (from exam on 05/06/2021- deferred today):   Vulva, bladder  and urethral meatus normal.     No lesions in the vagina.     Right Labia: No rash, tenderness, lesions, skin changes or Bartholin's cyst.    Left Labia: No tenderness, lesions, skin changes, Bartholin's cyst or rash.    No inguinal adenopathy present in the right or left side.    Pelvic Tanner Score: 5/5.    No vaginal discharge, erythema, tenderness or bleeding.       Right Adnexa: not tender, not full and no mass present.    Left Adnexa: not tender, not full and no mass present.    No cervical motion tenderness, friability, lesion or polyp.     Uterus is enlarged and irregular.     Uterus is not fixed or tender.     Uterine mass (mass extending near midline up to about halfway between upper pubic symphysis and ubmilicus. mobile, nontender) present.    Uterus is anteverted.     No urethral tenderness or mass present.     Pelvic exam was performed with patient in the lithotomy position.   MUSCULOSKELETAL: Normal range of motion. No edema and no tenderness. 2+ distal pulses.  Labs: No results found for this or any previous visit (from the past 336 hour(s)).  Imaging Studies: No results found.  Assessment:   ICD-10-CM   1. Menorrhagia with regular cycle  N92.0     2. Intramural, submucous, and subserous leiomyoma of uterus  D25.1    D25.0    D25.2        Plan: Patient will undergo surgical management with the above procedure.   The risks of surgery were discussed in detail with the patient including but not limited to: bleeding which may require transfusion or reoperation; infection which may require antibiotics; injury to surrounding organs which may involve bowel, bladder, ureters ; need for additional procedures including laparoscopy or laparotomy; thromboembolic phenomenon, surgical site problems and other postoperative/anesthesia complications. Likelihood of success in alleviating the patient's condition was discussed. Routine postoperative instructions will be reviewed with  the patient and her family in detail after surgery.  The patient concurred with the proposed plan, giving informed written consent for the surgery.  Preoperative prophylactic antibiotics, as indicated, and SCDs ordered on call to the OR.    Prentice Docker, MD 08/05/2021 8:19 AM

## 2021-08-05 NOTE — H&P (View-Only) (Signed)
Preoperative History and Physical  Christina Ellis is a 30 y.o. G0P0000 here for surgical management of menorrhagia with regular cycle and intramural, submucous, and subserous fibroids of the uterus.   No significant preoperative concerns.  History of Present Illness:  30 y.o. G0P0000 female who has heavy menses and resultant iron deficiency anemia.  She has been having heavy periods since menarche, which was around age 53-12.  Her periods come irregularly. More recently her menses didn't last as long. Usually her periods can last from 4-7 days.  They come monthly.  She passes clots, small ones.  The first couple of days are without clots, then she notes clots, but lighter overall flow.  She has moderate pain with her periods.  She states that she is having anal cramps with her periods.  She has a hard time have a bowel movement.  This is uncomfortable for her. She has never taken any medication to control her periods.  She is not sexually active.  She has never had an STD. She is unsure whether she has had a pap smear before.  Pelvic ultrasound (from report on 06/09/21):  Ultrasound demonstrates the following findings Adnexa: no masses seen  Uterus: endometrial stripe  12.0 mm Additional: multiple uterine masses: Right posterior mid uterine mass measures 3.7 x 3.1 x 4.7 cm.  Submucosal mid posterior uterine mass measuring 2.7 x 2.5 x 2.7 cm.  Subserosal fundal mass measuring 2.7 x 3 x 3.6 cm  Pap smear: negative, STI screen negative  From most recent CBC: hemoglobin 10.9, hematocrit 34.1. TSH 1.730  Proposed surgery: Hysteroscopy with dilation and curettage, submucosal myomectomy  Past Medical History:  Diagnosis Date   Anemia    Past Surgical History:  Procedure Laterality Date   NO PAST SURGERIES     OB History  Gravida Para Term Preterm AB Living  0 0 0 0 0 0  SAB IAB Ectopic Multiple Live Births  0 0 0 0 0  Patient denies any other pertinent gynecologic issues.   Current  Outpatient Medications on File Prior to Visit  Medication Sig Dispense Refill   acetaminophen (TYLENOL) 500 MG tablet Take 1,000 mg by mouth every 6 (six) hours as needed for moderate pain.     Acetaminophen-Caff-Pyrilamine (MIDOL COMPLETE PO) Take 2 tablets by mouth every 4 (four) hours as needed (pain).     ferrous sulfate 325 (65 FE) MG tablet Take 1 tablet by mouth daily with breakfast.     Omega 3-6-9 Fatty Acids (OMEGA-3 FUSION) LIQD Take 5 mLs by mouth daily.     No current facility-administered medications on file prior to visit.   Allergies  Allergen Reactions   Latex Itching    Social History:   reports that she has never smoked. She has never used smokeless tobacco. She reports that she does not drink alcohol and does not use drugs.  Family History  Problem Relation Age of Onset   Anemia Mother    Thyroid cancer Maternal Grandmother    Pancreatic cancer Maternal Grandfather     Review of Systems: Noncontributory  PHYSICAL EXAM: Blood pressure 122/80, height 5\' 7"  (1.702 m), weight 245 lb (111.1 kg). CONSTITUTIONAL: Well-developed, well-nourished female in no acute distress.  HENT:  Normocephalic, atraumatic, External right and left ear normal. Oropharynx is clear and moist EYES: Conjunctivae and EOM are normal. Pupils are equal, round, and reactive to light. No scleral icterus.  NECK: Normal range of motion, supple, no masses SKIN: Skin is warm and  dry. No rash noted. Not diaphoretic. No erythema. No pallor. [place]: Alert and oriented to person, place, and time. Normal reflexes, muscle tone coordination. No cranial nerve deficit noted. PSYCHIATRIC: Normal mood and affect. Normal behavior. Normal judgment and thought content. CARDIOVASCULAR: Normal heart rate noted, regular rhythm RESPIRATORY: Effort and breath sounds normal, no problems with respiration noted ABDOMEN: Soft, nontender, nondistended. PELVIC (from exam on 05/06/2021- deferred today):   Vulva, bladder  and urethral meatus normal.     No lesions in the vagina.     Right Labia: No rash, tenderness, lesions, skin changes or Bartholin's cyst.    Left Labia: No tenderness, lesions, skin changes, Bartholin's cyst or rash.    No inguinal adenopathy present in the right or left side.    Pelvic Tanner Score: 5/5.    No vaginal discharge, erythema, tenderness or bleeding.       Right Adnexa: not tender, not full and no mass present.    Left Adnexa: not tender, not full and no mass present.    No cervical motion tenderness, friability, lesion or polyp.     Uterus is enlarged and irregular.     Uterus is not fixed or tender.     Uterine mass (mass extending near midline up to about halfway between upper pubic symphysis and ubmilicus. mobile, nontender) present.    Uterus is anteverted.     No urethral tenderness or mass present.     Pelvic exam was performed with patient in the lithotomy position.   MUSCULOSKELETAL: Normal range of motion. No edema and no tenderness. 2+ distal pulses.  Labs: No results found for this or any previous visit (from the past 336 hour(s)).  Imaging Studies: No results found.  Assessment:   ICD-10-CM   1. Menorrhagia with regular cycle  N92.0     2. Intramural, submucous, and subserous leiomyoma of uterus  D25.1    D25.0    D25.2        Plan: Patient will undergo surgical management with the above procedure.   The risks of surgery were discussed in detail with the patient including but not limited to: bleeding which may require transfusion or reoperation; infection which may require antibiotics; injury to surrounding organs which may involve bowel, bladder, ureters ; need for additional procedures including laparoscopy or laparotomy; thromboembolic phenomenon, surgical site problems and other postoperative/anesthesia complications. Likelihood of success in alleviating the patient's condition was discussed. Routine postoperative instructions will be reviewed with  the patient and her family in detail after surgery.  The patient concurred with the proposed plan, giving informed written consent for the surgery.  Preoperative prophylactic antibiotics, as indicated, and SCDs ordered on call to the OR.    Prentice Docker, MD 08/05/2021 8:19 AM

## 2021-08-06 ENCOUNTER — Encounter
Admission: RE | Admit: 2021-08-06 | Discharge: 2021-08-06 | Disposition: A | Discharge status: Home / Self Care | Payer: 59 | Source: Ambulatory Visit | Attending: Obstetrics and Gynecology | Admitting: Obstetrics and Gynecology

## 2021-08-06 ENCOUNTER — Other Ambulatory Visit: Payer: Self-pay

## 2021-08-06 NOTE — Patient Instructions (Addendum)
Your procedure is scheduled on:08/12/2021 Report to the Registration Desk on the 1st floor of the [facility]. To find out your arrival time, please call [phone] between 1PM - 3PM on: 08/11/2021   REMEMBER: Instructions that are not followed completely may result in serious medical risk, up to and including death; or upon the discretion of your surgeon and anesthesiologist your surgery may need to be rescheduled.  Do not eat food after midnight the night before surgery.  No gum chewing, lozengers or hard candies.  You may however, drink CLEAR liquids up to 2 hours before you are scheduled to arrive for your surgery. Do not drink anything within 2 hours of your scheduled arrival time.  Clear liquids include: - water  - apple juice without pulp - gatorade (not RED, PURPLE, OR BLUE) - black coffee or tea (Do NOT add milk or creamers to the coffee or tea) Do NOT drink anything that is not on this list.  In addition, your doctor has ordered for you to drink the provided   Pre-Surgery Clear Carbohydrate Drink   Drinking this carbohydrate drink up to two hours before surgery helps to reduce insulin resistance and improve patient outcomes. Please complete drinking 2 hours prior to scheduled arrival time.    One week prior to surgery: Stop Anti-inflammatories (NSAIDS) such as Advil, Aleve, Ibuprofen, Motrin, Naproxen, Naprosyn and Aspirin based products such as Excedrin, Goodys Powder, BC Powder.  Stop ANY OVER THE COUNTER supplements until after surgery like Ferrous sulfate, Omega,Midol complete.  You may however, continue to take Tylenol if needed for pain up until the day of surgery.   No Alcohol for 24 hours before or after surgery.  No Smoking including e-cigarettes for 24 hours prior to surgery.  No chewable tobacco products for at least 6 hours prior to surgery.  No nicotine patches on the day of surgery.  Do not use any "recreational" drugs for at least a week prior to your  surgery.  Please be advised that the combination of cocaine and anesthesia may have negative outcomes, up to and including death. If you test positive for cocaine, your surgery will be cancelled.  On the morning of surgery brush your teeth with toothpaste and water, you may rinse your mouth with mouthwash if you wish. Do not swallow any toothpaste or mouthwash.  Use CHG Soap or wipes as directed on instruction sheet.  Do not wear jewelry, make-up, hairpins, clips or nail polish.  Do not wear lotions, powders, or perfumes.   Do not shave body from the neck down 48 hours prior to surgery just in case you cut yourself which could leave a site for infection.  Also, freshly shaved skin may become irritated if using the CHG soap.  Contact lenses, hearing aids and dentures may not be worn into surgery.  Do not bring valuables to the hospital. [facility] is not responsible for any missing/lost belongings or valuables.    Notify your doctor if there is any change in your medical condition (cold, fever, infection).  Wear comfortable clothing (specific to your surgery type) to the hospital.  After surgery, you can help prevent lung complications by doing breathing exercises.  Take deep breaths and cough every 1-2 hours. Your doctor may order a device called an Incentive Spirometer to help you take deep breaths.  When coughing or sneezing, hold a pillow firmly against your incision with both hands. This is called "splinting." Doing this helps protect your incision. It also decreases  belly discomfort.  If you are being admitted to the hospital overnight, leave your suitcase in the car. After surgery it may be brought to your room.  If you are being discharged the day of surgery, you will not be allowed to drive home. You will need a responsible adult (18 years or older) to drive you home and stay with you that night.   If you are taking public transportation, you will need to have a  responsible adult (18 years or older) with you. Please confirm with your physician that it is acceptable to use public transportation.   Please call the [facility] at [phone] if you have any questions about these instructions.  Surgery Visitation Policy:  Patients undergoing a surgery or procedure may have one family member or support person with them as long as that person is not COVID-19 positive or experiencing its symptoms.  That person may remain in the waiting area during the procedure and may rotate out with other people.  Inpatient Visitation:    Visiting hours are 7 a.m. to 8 p.m. Up to two visitors ages 16+ are allowed at one time in a patient room. The visitors may rotate out with other people during the day. Visitors must check out when they leave, or other visitors will not be allowed. One designated support person may remain overnight. The visitor must pass COVID-19 screenings, use hand sanitizer when entering and exiting the patient's room and wear a mask at all times, including in the patient's room. Patients must also wear a mask when staff or their visitor are in the room. Masking is required regardless of vaccination status.

## 2021-08-12 ENCOUNTER — Encounter: Payer: Self-pay | Admitting: Obstetrics and Gynecology

## 2021-08-12 ENCOUNTER — Ambulatory Visit: Payer: 59 | Admitting: Anesthesiology

## 2021-08-12 ENCOUNTER — Ambulatory Visit
Admission: RE | Admit: 2021-08-12 | Discharge: 2021-08-12 | Disposition: A | Discharge status: Home / Self Care | Payer: 59 | Attending: Obstetrics and Gynecology | Admitting: Obstetrics and Gynecology

## 2021-08-12 ENCOUNTER — Other Ambulatory Visit: Payer: Self-pay

## 2021-08-12 ENCOUNTER — Encounter
Admission: RE | Disposition: A | Discharge status: Home / Self Care | Payer: Self-pay | Source: Home / Self Care | Attending: Obstetrics and Gynecology

## 2021-08-12 DIAGNOSIS — N840 Polyp of corpus uteri: Secondary | ICD-10-CM | POA: Clinically undetermined

## 2021-08-12 DIAGNOSIS — D500 Iron deficiency anemia secondary to blood loss (chronic): Secondary | ICD-10-CM | POA: Insufficient documentation

## 2021-08-12 DIAGNOSIS — Z91040 Latex allergy status: Secondary | ICD-10-CM | POA: Diagnosis not present

## 2021-08-12 DIAGNOSIS — D250 Submucous leiomyoma of uterus: Secondary | ICD-10-CM | POA: Diagnosis not present

## 2021-08-12 DIAGNOSIS — D259 Leiomyoma of uterus, unspecified: Secondary | ICD-10-CM | POA: Diagnosis not present

## 2021-08-12 DIAGNOSIS — N920 Excessive and frequent menstruation with regular cycle: Secondary | ICD-10-CM | POA: Diagnosis present

## 2021-08-12 DIAGNOSIS — D509 Iron deficiency anemia, unspecified: Secondary | ICD-10-CM | POA: Diagnosis not present

## 2021-08-12 DIAGNOSIS — Z79899 Other long term (current) drug therapy: Secondary | ICD-10-CM | POA: Diagnosis not present

## 2021-08-12 DIAGNOSIS — D252 Subserosal leiomyoma of uterus: Secondary | ICD-10-CM

## 2021-08-12 HISTORY — PX: DILATATION & CURETTAGE/HYSTEROSCOPY WITH MYOSURE: SHX6511

## 2021-08-12 LAB — POCT PREGNANCY, URINE: Preg Test, Ur: NEGATIVE

## 2021-08-12 SURGERY — DILATATION & CURETTAGE/HYSTEROSCOPY WITH MYOSURE
Anesthesia: General

## 2021-08-12 MED ORDER — PROPOFOL 10 MG/ML IV BOLUS
INTRAVENOUS | Status: AC
  Filled 2021-08-12: qty 20

## 2021-08-12 MED ORDER — MIDAZOLAM HCL 2 MG/2ML IJ SOLN
INTRAMUSCULAR | Status: AC
  Filled 2021-08-12: qty 2

## 2021-08-12 MED ORDER — LACTATED RINGERS IV SOLN: INTRAVENOUS | Status: DC

## 2021-08-12 MED ORDER — KETOROLAC TROMETHAMINE 30 MG/ML IJ SOLN
INTRAMUSCULAR | Status: DC | PRN
  Administered 2021-08-12: 30 mg via INTRAVENOUS

## 2021-08-12 MED ORDER — IBUPROFEN 600 MG PO TABS: 600.0000 mg | ORAL_TABLET | Freq: Four times a day (QID) | ORAL | 0 refills | Status: AC | PRN

## 2021-08-12 MED ORDER — OXYCODONE HCL 5 MG PO TABS
5.0000 mg | ORAL_TABLET | Freq: Once | ORAL | Status: AC | PRN
Start: 2021-08-12 — End: 2021-08-12

## 2021-08-12 MED ORDER — MIDAZOLAM HCL 2 MG/2ML IJ SOLN
INTRAMUSCULAR | Status: DC | PRN
Start: 2021-08-12 — End: 2021-08-12
  Administered 2021-08-12: 2 mg via INTRAVENOUS

## 2021-08-12 MED ORDER — SODIUM CHLORIDE 0.9 % IR SOLN
Status: DC | PRN
  Administered 2021-08-12: 10500 mL

## 2021-08-12 MED ORDER — FAMOTIDINE 20 MG PO TABS
ORAL_TABLET | ORAL | Status: AC
  Administered 2021-08-12: 20 mg via ORAL
  Filled 2021-08-12: qty 1

## 2021-08-12 MED ORDER — OXYCODONE HCL 5 MG/5ML PO SOLN
5.0000 mg | Freq: Once | ORAL | Status: AC | PRN
Start: 2021-08-12 — End: 2021-08-12

## 2021-08-12 MED ORDER — FENTANYL CITRATE (PF) 100 MCG/2ML IJ SOLN
25.0000 ug | INTRAMUSCULAR | Status: DC | PRN
  Administered 2021-08-12: 50 ug via INTRAVENOUS

## 2021-08-12 MED ORDER — CHLORHEXIDINE GLUCONATE 0.12 % MT SOLN
OROMUCOSAL | Status: AC
  Administered 2021-08-12: 15 mL via OROMUCOSAL
  Filled 2021-08-12: qty 15

## 2021-08-12 MED ORDER — CHLORHEXIDINE GLUCONATE 0.12 % MT SOLN: 15.0000 mL | Freq: Once | OROMUCOSAL | Status: AC

## 2021-08-12 MED ORDER — ACETAMINOPHEN 10 MG/ML IV SOLN: 1000.0000 mg | Freq: Once | INTRAVENOUS | Status: DC | PRN

## 2021-08-12 MED ORDER — LIDOCAINE HCL (CARDIAC) PF 100 MG/5ML IV SOSY
PREFILLED_SYRINGE | INTRAVENOUS | Status: DC | PRN
  Administered 2021-08-12: 100 mg via INTRAVENOUS

## 2021-08-12 MED ORDER — PROMETHAZINE HCL 25 MG/ML IJ SOLN
6.2500 mg | INTRAMUSCULAR | Status: DC | PRN
Start: 2021-08-12 — End: 2021-08-12

## 2021-08-12 MED ORDER — PROPOFOL 500 MG/50ML IV EMUL
INTRAVENOUS | Status: AC
  Filled 2021-08-12: qty 50

## 2021-08-12 MED ORDER — ORAL CARE MOUTH RINSE: 15.0000 mL | Freq: Once | OROMUCOSAL | Status: AC

## 2021-08-12 MED ORDER — FAMOTIDINE 20 MG PO TABS: 20.0000 mg | ORAL_TABLET | Freq: Once | ORAL | Status: AC

## 2021-08-12 MED ORDER — ONDANSETRON HCL 4 MG/2ML IJ SOLN
INTRAMUSCULAR | Status: DC | PRN
  Administered 2021-08-12: 4 mg via INTRAVENOUS

## 2021-08-12 MED ORDER — DEXMEDETOMIDINE (PRECEDEX) IN NS 20 MCG/5ML (4 MCG/ML) IV SYRINGE
PREFILLED_SYRINGE | INTRAVENOUS | Status: DC | PRN
  Administered 2021-08-12: 12 ug via INTRAVENOUS
  Administered 2021-08-12 (×2): 8 ug via INTRAVENOUS
  Administered 2021-08-12: 12 ug via INTRAVENOUS

## 2021-08-12 MED ORDER — GLYCOPYRROLATE 0.2 MG/ML IJ SOLN
INTRAMUSCULAR | Status: DC | PRN
  Administered 2021-08-12: .2 mg via INTRAVENOUS

## 2021-08-12 MED ORDER — SILVER NITRATE-POT NITRATE 75-25 % EX MISC
CUTANEOUS | Status: DC | PRN
  Administered 2021-08-12: 1
  Administered 2021-08-12: 4

## 2021-08-12 MED ORDER — DEXAMETHASONE SODIUM PHOSPHATE 10 MG/ML IJ SOLN
INTRAMUSCULAR | Status: DC | PRN
  Administered 2021-08-12: 8 mg via INTRAVENOUS

## 2021-08-12 MED ORDER — OXYCODONE HCL 5 MG PO TABS
ORAL_TABLET | ORAL | Status: AC
  Administered 2021-08-12: 5 mg via ORAL
  Filled 2021-08-12: qty 1

## 2021-08-12 MED ORDER — PROPOFOL 10 MG/ML IV BOLUS
INTRAVENOUS | Status: DC | PRN
  Administered 2021-08-12: 50 mg via INTRAVENOUS
  Administered 2021-08-12: 150 mg via INTRAVENOUS

## 2021-08-12 MED ORDER — FENTANYL CITRATE (PF) 100 MCG/2ML IJ SOLN
INTRAMUSCULAR | Status: DC | PRN
  Administered 2021-08-12 (×2): 50 ug via INTRAVENOUS

## 2021-08-12 MED ORDER — FENTANYL CITRATE (PF) 100 MCG/2ML IJ SOLN
INTRAMUSCULAR | Status: AC
  Filled 2021-08-12: qty 2

## 2021-08-12 MED ORDER — PROPOFOL 500 MG/50ML IV EMUL
INTRAVENOUS | Status: DC | PRN
  Administered 2021-08-12: 30 ug/kg/min via INTRAVENOUS

## 2021-08-12 SURGICAL SUPPLY — 23 items
CATH FOLEY 2WAY  5CC 16FR (CATHETERS) ×1
CATH FOLEY 2WAY 5CC 16FR (CATHETERS) ×1
CATH URTH 16FR FL 2W BLN LF (CATHETERS) ×1 IMPLANT
DEVICE MYOSURE REACH (MISCELLANEOUS) ×2 IMPLANT
ELECT REM PT RETURN 9FT ADLT (ELECTROSURGICAL) ×2
ELECTRODE REM PT RTRN 9FT ADLT (ELECTROSURGICAL) ×1 IMPLANT
GAUZE 4X4 16PLY ~~LOC~~+RFID DBL (SPONGE) ×2 IMPLANT
GLOVE SURG ENC MOIS LTX SZ7 (GLOVE) ×2 IMPLANT
GLOVE SURG UNDER POLY LF SZ7.5 (GLOVE) ×2 IMPLANT
GOWN STRL REUS W/ TWL LRG LVL3 (GOWN DISPOSABLE) ×2 IMPLANT
GOWN STRL REUS W/TWL LRG LVL3 (GOWN DISPOSABLE) ×4
IV NS IRRIG 3000ML ARTHROMATIC (IV SOLUTION) ×8 IMPLANT
KIT PROCEDURE FLUENT (KITS) ×2 IMPLANT
KIT TURNOVER CYSTO (KITS) ×2 IMPLANT
MANIFOLD NEPTUNE II (INSTRUMENTS) ×2 IMPLANT
PACK DNC HYST (MISCELLANEOUS) ×2 IMPLANT
PAD OB MATERNITY 4.3X12.25 (PERSONAL CARE ITEMS) ×2 IMPLANT
PAD PREP 24X41 OB/GYN DISP (PERSONAL CARE ITEMS) ×2 IMPLANT
SCRUB EXIDINE 4% CHG 4OZ (MISCELLANEOUS) ×2 IMPLANT
SEAL ROD LENS SCOPE MYOSURE (ABLATOR) ×2 IMPLANT
SURGILUBE 2OZ TUBE FLIPTOP (MISCELLANEOUS) ×2 IMPLANT
TUBING CONNECTING 10 (TUBING) ×2 IMPLANT
WATER STERILE IRR 500ML POUR (IV SOLUTION) ×2 IMPLANT

## 2021-08-12 NOTE — Anesthesia Postprocedure Evaluation (Signed)
Anesthesia Post Note  Patient: Christina Ellis  Procedure(s) Performed: DILATATION & CURETTAGE/HYSTEROSCOPY WITH MYOSURE with submucosal fibroidectomy and endometrial polypectomy  Patient location during evaluation: PACU Anesthesia Type: General Level of consciousness: awake and alert Pain management: pain level controlled Vital Signs Assessment: post-procedure vital signs reviewed and stable Respiratory status: spontaneous breathing, nonlabored ventilation and respiratory function stable Cardiovascular status: blood pressure returned to baseline and stable Postop Assessment: no apparent nausea or vomiting Anesthetic complications: no   No notable events documented.   Last Vitals:  Vitals:   08/12/21 1259 08/12/21 1314  BP:  125/80  Pulse:  68  Resp: 16 18  Temp: (!) 36.3 C 36.6 C  SpO2:  100%    Last Pain:  Vitals:   08/12/21 1314  TempSrc: Temporal  PainSc: 0-No pain                 [place] Ouch

## 2021-08-12 NOTE — Anesthesia Preprocedure Evaluation (Addendum)
Anesthesia Evaluation  Patient identified by MRN, date of birth, ID band Patient awake    Reviewed: Allergy & Precautions, NPO status , Patient's Chart, lab work & pertinent test results  Airway Mallampati: II  TM Distance: >3 FB Neck ROM: Full    Dental no notable dental hx. 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), anemia ,   Anesthesia Other Findings Menorrhagia with regular cycle  Fibroid uterus Obesity  Reproductive/Obstetrics negative OB ROS                            Anesthesia Physical Anesthesia Plan  ASA: 2  Anesthesia Plan: General   Post-op Pain Management:    Induction: Intravenous  PONV Risk Score and Plan: 3 and Propofol infusion, Ondansetron, Dexamethasone and Midazolam  Airway Management Planned: LMA  Additional Equipment:   Intra-op Plan:   Post-operative Plan:   Informed Consent: I have reviewed the patients History and Physical, chart, labs and discussed the procedure including the risks, benefits and alternatives for the proposed anesthesia with the patient or authorized representative who has indicated his/her understanding and acceptance.     Dental advisory given  Plan Discussed with: Anesthesiologist and CRNA  Anesthesia Plan Comments:        Anesthesia Quick Evaluation

## 2021-08-12 NOTE — Transfer of Care (Signed)
Immediate Anesthesia Transfer of Care Note  Patient: Margaurite Salido Sedivy  Procedure(s) Performed: DILATATION & CURETTAGE/HYSTEROSCOPY WITH MYOSURE  Patient Location: PACU  Anesthesia Type:General  Level of Consciousness: drowsy and patient cooperative  Airway & Oxygen Therapy: Patient Spontanous Breathing  Post-op Assessment: Report given to RN and Post -op Vital signs reviewed and stable  Post vital signs: Reviewed and stable  Last Vitals:  Vitals Value Taken Time  BP 118/81 08/12/21 1205  Temp    Pulse 65 08/12/21 1207  Resp 18 08/12/21 1207  SpO2 100 % 08/12/21 1207  Vitals shown include unvalidated device data.  Last Pain:  Vitals:   08/12/21 0805  TempSrc: Temporal  PainSc: 2          Complications: No notable events documented.

## 2021-08-12 NOTE — Op Note (Signed)
Operative Note   Name: Christina Ellis  MRN: [id number]  Date of Service: 08/12/2021  PRE-OP DIAGNOSIS:  1) Menorrhagia with regular cycle [N92.0] 2) Submucosal fibroid [D25.9] 3) Endometrial Polyp [N84.0]   POST-OP DIAGNOSIS:  1) Menorrhagia with regular cycle [N92.0] 2) Submucosal fibroid [D25.9] 3) Endometrial Polyp [N84.0]   SURGEON: Surgeon(s) and Role:    Will Bonnet, MD - Primary   PROCEDURE: Procedure(s): 1) Dilation and curettage 2) Hysteroscopy 3) Submucosal myomectomy 4) Endometrial Polypectomy  ANESTHESIA: General LMA  ESTIMATED BLOOD LOSS: 50 mL  DRAINS: none   TOTAL IV FLUIDS: 600 mL  SPECIMENS: (all sent together) 1) Endometrial curettings 2) Submucosal leiomyomata 3) Endometrial Polyp  VTE PROPHYLAXIS: SCDs to the bilateral lower extremities  ANTIBIOTICS: none indicated  FLUID DEFICIT: 481 mL  COMPLICATIONS: none  DISPOSITION: PACU - hemodynamically stable.  CONDITION: stable  INDICATION: 30 y.o. G0P0000 female who presented with heavy, regular periods. Ultrasound demonstrated submucosal fibroid.  Decision made to procedure with the above procedure.    FINDINGS: Exam under anesthesia revealed small, mobile anteverted uterus with a possible anterior mass and bilateral adnexa without masses or fullness. Hysteroscopy revealed a ~4 cm posterior fibroid that was likely also intramural (submucosal component extended about 3 cm into the uterine cavity.  There was also a polypoid lesion near the right cornu at the fundus.  The rest of the uterine cavity appeared normal with normal-appearing bilateral tubal ostia and normal appearing endocervical canal.  PROCEDURE IN DETAIL:  After informed consent was obtained, the patient was taken to the operating room where anesthesia was obtained without difficulty. The patient was positioned in the dorsal lithotomy position in [place].  The patient's bladder was catheterized with an in and out foley  catheter.  The patient was examined under anesthesia, with the above noted findings.  The bi-valved speculum was placed inside the patient's vagina, and the the anterior lip of the cervix was grasped with the tenaculum.  The cervix was progressively dilated to a 7 mm Hegar dilator.  The hysteroscope was introduced, with the above noted findings.  The MyoSure Reach device was utilized to remove the polypoid lesion first, then the fibroid lesion.  Both were removed without difficulty.    The hystersocope was removed and the uterine cavity was curetted until a gritty texture was noted, yielding medium endometrial curettings.  The hysteroscope was introduced again and pressure was dropped to that of well below the MAP.  Hemostasis was noted, and all instruments were removed, with hemostasis noted throughout.  After removal of the tenaculum, silver nitrate was utilized to obtain hemostasis at the tenaculum entry sites.   She was then taken out of dorsal lithotomy.  The patient tolerated the procedure well.  Sponge, lap and needle counts were correct x2.  The patient was taken to recovery room in excellent condition.  Will Bonnet, MD, [place] 08/12/2021 12:00 PM

## 2021-08-12 NOTE — Interval H&P Note (Signed)
History and Physical Interval Note:  08/12/2021 9:21 AM  Christina Ellis  has presented today for surgery, with the diagnosis of Menorrhagia with regular cycle Fibroid uterus.  The various methods of treatment have been discussed with the patient and family. After consideration of risks, benefits and other options for treatment, the patient has consented to  Procedure(s): [place] (N/A) and submucosal myomectomy as a surgical intervention.  The patient's history has been reviewed, patient examined, no change in status, stable for surgery.  I have reviewed the patient's chart and labs.  Questions were answered to the patient's satisfaction.  She voiced understanding that it is possible that we would need to return to the OR, if unable to complete resection today or if fibroid that is not accessible within the uterine wall is pushed into the cavity at a later point.  Will proceed as planned.   Prentice Docker, MD, [facility], [facility] 08/12/2021 9:22 AM

## 2021-08-12 NOTE — Anesthesia Procedure Notes (Signed)
Procedure Name: LMA Insertion Date/Time: 08/12/2021 10:32 AM Performed by: Lerry Liner, CRNA Pre-anesthesia Checklist: Patient identified, Emergency Drugs available, Suction available and Patient being monitored Patient Re-evaluated:Patient Re-evaluated prior to induction Oxygen Delivery Method: Circle system utilized Preoxygenation: Pre-oxygenation with 100% oxygen Induction Type: IV induction Ventilation: Mask ventilation without difficulty LMA: LMA inserted LMA Size: 4.0 Number of attempts: 1 Placement Confirmation: positive ETCO2 and breath sounds checked- equal and bilateral Tube secured with: Tape Dental Injury: Teeth and Oropharynx as per pre-operative assessment

## 2021-08-12 NOTE — Discharge Instructions (Signed)

## 2021-08-13 LAB — SURGICAL PATHOLOGY

## 2021-08-25 ENCOUNTER — Ambulatory Visit: Payer: 59 | Admitting: Obstetrics and Gynecology

## 2021-08-25 ENCOUNTER — Ambulatory Visit (INDEPENDENT_AMBULATORY_CARE_PROVIDER_SITE_OTHER): Payer: 59 | Admitting: Obstetrics and Gynecology

## 2021-08-25 ENCOUNTER — Encounter: Payer: Self-pay | Admitting: Obstetrics and Gynecology

## 2021-08-25 ENCOUNTER — Other Ambulatory Visit: Payer: Self-pay

## 2021-08-25 VITALS — BP 124/80 | HR 93 | Ht 67.0 in | Wt 244.0 lb

## 2021-08-25 DIAGNOSIS — D251 Intramural leiomyoma of uterus: Secondary | ICD-10-CM

## 2021-08-25 DIAGNOSIS — N920 Excessive and frequent menstruation with regular cycle: Secondary | ICD-10-CM

## 2021-08-25 DIAGNOSIS — D250 Submucous leiomyoma of uterus: Secondary | ICD-10-CM

## 2021-08-25 DIAGNOSIS — D252 Subserosal leiomyoma of uterus: Secondary | ICD-10-CM

## 2021-08-25 DIAGNOSIS — N840 Polyp of corpus uteri: Secondary | ICD-10-CM

## 2021-08-25 DIAGNOSIS — Z09 Encounter for follow-up examination after completed treatment for conditions other than malignant neoplasm: Secondary | ICD-10-CM

## 2021-08-25 NOTE — Progress Notes (Signed)
   Postoperative Follow-up Patient presents post op from hysteroscopy, dilation and curettage, submucosal myomectomy 2 weeks ago for heavy menstrual periods with resultant anemia, uterine fibroids (one submucosal).  Subjective: Patient reports some improvement in her preop symptoms. Eating a regular diet without difficulty. The patient is not having any pain.  Activity: normal activities of daily living.  She denies fever, chills, nausea, and vomiting.    Pathology report: DIAGNOSIS:  A. ENDOMETRIUM, POLYP AND SUBMUCOSAL FIBROID; CURETTAGE:  - FRAGMENTS OF BENIGN ENDOMETRIAL POLYP.  - FRAGMENTS OF BENIGN SMOOTH MUSCLE, COMPATIBLE WITH SUBMUCOSAL  LEIOMYOMA.  - NEGATIVE FOR ATYPIA / EIN AND MALIGNANCY.   Objective: Vital Signs: BP 124/80 (Cuff Size: Normal)   Pulse 93   Ht 5\' 7"  (1.702 m)   Wt 244 lb (110.7 kg)   LMP 08/07/2021   BMI 38.22 kg/m  Physical Exam Constitutional:      General: She is not in acute distress.    Appearance: Normal appearance.  HENT:     Head: Normocephalic and atraumatic.  Eyes:     General: No scleral icterus.    Conjunctiva/sclera: Conjunctivae normal.  Neurological:     General: No focal deficit present.     Mental Status: She is alert and oriented to person, place, and time.     Cranial Nerves: No cranial nerve deficit.  Psychiatric:        Mood and Affect: Mood normal.        Behavior: Behavior normal.        Judgment: Judgment normal.     Assessment: 30 y.o. s/p above surgery progressing well  Plan: Patient has done well after surgery with no apparent complications.  I have discussed the post-operative course to date, and the expected progress moving forward.  The patient understands what complications to be concerned about.  I will see the patient in routine follow up, or sooner if needed.    Activity plan: No restriction.  She does not want to start contraception at this time. Will need thoughtful discussion of what to start moving  forward.  Prentice Docker, MD  08/25/2021, 10:11 AM

## 2021-08-29 ENCOUNTER — Other Ambulatory Visit: Payer: Self-pay | Admitting: *Deleted

## 2021-08-29 DIAGNOSIS — D500 Iron deficiency anemia secondary to blood loss (chronic): Secondary | ICD-10-CM

## 2021-09-05 ENCOUNTER — Encounter: Payer: Self-pay | Admitting: Internal Medicine

## 2021-09-05 ENCOUNTER — Inpatient Hospital Stay (HOSPITAL_BASED_OUTPATIENT_CLINIC_OR_DEPARTMENT_OTHER): Payer: 59 | Admitting: Internal Medicine

## 2021-09-05 ENCOUNTER — Inpatient Hospital Stay: Payer: 59

## 2021-09-05 ENCOUNTER — Inpatient Hospital Stay: Payer: 59 | Attending: Internal Medicine

## 2021-09-05 ENCOUNTER — Other Ambulatory Visit: Payer: Self-pay

## 2021-09-05 DIAGNOSIS — Z79899 Other long term (current) drug therapy: Secondary | ICD-10-CM | POA: Diagnosis not present

## 2021-09-05 DIAGNOSIS — D500 Iron deficiency anemia secondary to blood loss (chronic): Secondary | ICD-10-CM

## 2021-09-05 DIAGNOSIS — D508 Other iron deficiency anemias: Secondary | ICD-10-CM | POA: Diagnosis not present

## 2021-09-05 LAB — CBC WITH DIFFERENTIAL/PLATELET
Abs Immature Granulocytes: 0.02 10*3/uL (ref 0.00–0.07)
Basophils Absolute: 0 10*3/uL (ref 0.0–0.1)
Basophils Relative: 1 %
Eosinophils Absolute: 0.1 10*3/uL (ref 0.0–0.5)
Eosinophils Relative: 2 %
HCT: 30.8 % — ABNORMAL LOW (ref 36.0–46.0)
Hemoglobin: 9.8 g/dL — ABNORMAL LOW (ref 12.0–15.0)
Immature Granulocytes: 0 %
Lymphocytes Relative: 39 %
Lymphs Abs: 2.2 10*3/uL (ref 0.7–4.0)
MCH: 25.7 pg — ABNORMAL LOW (ref 26.0–34.0)
MCHC: 31.8 g/dL (ref 30.0–36.0)
MCV: 80.8 fL (ref 80.0–100.0)
Monocytes Absolute: 0.5 10*3/uL (ref 0.1–1.0)
Monocytes Relative: 8 %
Neutro Abs: 2.9 10*3/uL (ref 1.7–7.7)
Neutrophils Relative %: 50 %
Platelets: 334 10*3/uL (ref 150–400)
RBC: 3.81 MIL/uL — ABNORMAL LOW (ref 3.87–5.11)
RDW: 15 % (ref 11.5–15.5)
WBC: 5.8 10*3/uL (ref 4.0–10.5)
nRBC: 0 % (ref 0.0–0.2)

## 2021-09-05 LAB — PREGNANCY, URINE: Preg Test, Ur: NEGATIVE

## 2021-09-05 MED ORDER — IRON SUCROSE 20 MG/ML IV SOLN
200.0000 mg | Freq: Once | INTRAVENOUS | Status: AC
  Administered 2021-09-05: 200 mg via INTRAVENOUS
  Filled 2021-09-05: qty 10

## 2021-09-05 MED ORDER — SODIUM CHLORIDE 0.9 % IV SOLN: 200.0000 mg | Freq: Once | INTRAVENOUS | Status: DC

## 2021-09-05 MED ORDER — SODIUM CHLORIDE 0.9 % IV SOLN
Freq: Once | INTRAVENOUS | Status: AC
  Filled 2021-09-05: qty 250

## 2021-09-05 NOTE — Progress Notes (Signed)
[name]: [name], PA-C as PCP - General (Physician Assistant)  CHIEF COMPLAINTS/PURPOSE OF CONSULTATION: ANEMIA   HEMATOLOGY HISTORY:  # IRON DEF ANEMIA-likely secondary menorrhagia [EGD/colonoscopy/capsule/Bone marrow Biopsy-NONE]; JAN hb 9 [PCP] s/p Venofer.   #Gynecology-June 09, 2021 ultrasound pending-?  Fibroids  HISTORY OF PRESENTING ILLNESS: Alone.  Ambulating independently.  Christina Ellis 30 y.o.  female with iron deficiency anemia secondary to heavy menstrual cycles is here for follow-up.  S/p evaluation with gynecology-question fibroids -patient recently underwent dilation curettage.  Noticed to have improvement of her menstrual cycles.  Continues to complain of mild to moderate fatigue.   Review of Systems  Constitutional:  Positive for malaise/fatigue. Negative for chills, diaphoresis, fever and weight loss.  HENT:  Negative for nosebleeds and sore throat.   Eyes:  Negative for double vision.  Respiratory:  Negative for cough, hemoptysis, sputum production, shortness of breath and wheezing.   Cardiovascular:  Negative for chest pain, palpitations, orthopnea and leg swelling.  Gastrointestinal:  Negative for abdominal pain, blood in stool, constipation, diarrhea, heartburn, melena, nausea and vomiting.  Genitourinary:  Negative for dysuria, frequency and urgency.  Musculoskeletal:  Negative for back pain and joint pain.  Skin: Negative.  Negative for itching and rash.  Neurological:  Positive for dizziness. Negative for tingling, focal weakness, weakness and headaches.  Endo/Heme/Allergies:  Does not bruise/bleed easily.  Psychiatric/Behavioral:  Negative for depression. The patient is not nervous/anxious and does not have insomnia.    MEDICAL HISTORY:  Past Medical History:  Diagnosis Date   Anemia     SURGICAL HISTORY: Past Surgical History:  Procedure Laterality Date   DILATATION &  CURETTAGE/HYSTEROSCOPY WITH MYOSURE N/A 08/12/2021   Procedure: DILATATION & CURETTAGE/HYSTEROSCOPY WITH MYOSURE with submucosal fibroidectomy and endometrial polypectomy;  Surgeon: Will Bonnet, MD;  Location: [facility] ORS;  Service: Gynecology;  Laterality: N/A;   NO PAST SURGERIES      SOCIAL HISTORY: Social History   Socioeconomic History   Marital status: Single    Spouse name: Not on file   Number of children: Not on file   Years of education: Not on file   Highest education level: Not on file  Occupational History   Not on file  Tobacco Use   Smoking status: Never   Smokeless tobacco: Never  Vaping Use   Vaping Use: Never used  Substance and Sexual Activity   Alcohol use: No    Comment: occasional glass of wine   Drug use: Never   Sexual activity: Not Currently    Birth control/protection: None  Other Topics Concern   Not on file  Social History Narrative   Emergency dept; 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 Lives in [place]; with mom.    Social Determinants of Health   Financial Resource Strain: Not on file  Food Insecurity: Not on file  Transportation Needs: Not on file  Physical Activity: Not on file  Stress: Not on file  Social Connections: Not on file  Intimate Partner Violence: Not on file    FAMILY HISTORY: Family History  Problem Relation Age of Onset   Anemia Mother    Thyroid cancer Maternal Grandmother    Pancreatic cancer Maternal Grandfather     ALLERGIES:  is allergic to latex.  MEDICATIONS:  Current Outpatient Medications  Medication Sig Dispense Refill   ferrous sulfate 325 (65 FE) MG tablet Take 1 tablet by mouth daily with breakfast. (Patient not taking: Reported on 09/05/2021)  ibuprofen (ADVIL) 600 MG tablet Take 1 tablet (600 mg total) by mouth every 6 (six) hours as needed for mild pain or cramping. (Patient not taking: No sig reported) 30 tablet 0   Omega 3-6-9 Fatty Acids (OMEGA-3 FUSION) LIQD Take 5 mLs by mouth  daily. (Patient not taking: No sig reported)     No current facility-administered medications for this visit.      PHYSICAL EXAMINATION:   Vitals:   09/05/21 1311  BP: 116/78  Pulse: 85  Resp: 18  Temp: 98 F (36.7 C)   Filed Weights   09/05/21 1311  Weight: 243 lb 12.8 oz (110.6 kg)    Physical Exam Constitutional:      Comments: Ambulating; with mother.   HENT:     Head: Normocephalic and atraumatic.     Mouth/Throat:     Pharynx: No oropharyngeal exudate.  Eyes:     Pupils: Pupils are equal, round, and reactive to light.  Cardiovascular:     Rate and Rhythm: Normal rate and regular rhythm.  Pulmonary:     Effort: Pulmonary effort is normal. No respiratory distress.     Breath sounds: Normal breath sounds. No wheezing.  Abdominal:     General: Bowel sounds are normal. There is no distension.     Palpations: Abdomen is soft. There is no mass.     Tenderness: There is no abdominal tenderness. There is no guarding or rebound.  Musculoskeletal:        General: No tenderness. Normal range of motion.     Cervical back: Normal range of motion and neck supple.  Skin:    General: Skin is warm.  Neurological:     Mental Status: She is alert and oriented to person, place, and time.  Psychiatric:        Mood and Affect: Affect normal.    LABORATORY DATA:  I have reviewed the data as listed Lab Results  Component Value Date   WBC 5.8 09/05/2021   HGB 9.8 (L) 09/05/2021   HCT 30.8 (L) 09/05/2021   MCV 80.8 09/05/2021   PLT 334 09/05/2021   No results for input(s): NA, K, CL, CO2, GLUCOSE, BUN, CREATININE, CALCIUM, GFRNONAA, GFRAA, PROT, ALBUMIN, AST, ALT, ALKPHOS, BILITOT, BILIDIR, IBILI in the last 8760 hours.   No results found.   Iron deficiency anemia due to chronic blood loss #Iron deficient anemia likely secondary to chronic blood loss/menorrhagia.  Hb- 9. S/p IV venofer- improved  # Today Hb- 9.8 overall stable proceed with IV venofer.    #Menorrhagia [uerine ]-s/p GYN evaluation- [place] aug 1st-2022 s/p D&C [Dr.jackson]- none at this time.   # DISPOSITION: # venofer today # follow up in 6 month MD; labs- cbc/urine pregnancy- possible venofer Dr.B   All questions were answered. The patient knows to call the clinic with any problems, questions or concerns.    [name], MD 09/10/2021 10:14 PM

## 2021-09-05 NOTE — Patient Instructions (Signed)
[facility]  Discharge Instructions: Thank you for choosing [facility] to provide your oncology and hematology care.  If you have a lab appointment with the Cancer Center, please go directly to the Cancer Center and check in at the registration area.  Wear comfortable clothing and clothing appropriate for easy access to any Portacath or PICC line.   We strive to give you quality time with your provider. You may need to reschedule your appointment if you arrive late (15 or more minutes).  Arriving late affects you and other patients whose appointments are after yours.  Also, if you miss three or more appointments without notifying the office, you may be dismissed from the clinic at the provider's discretion.      For prescription refill requests, have your pharmacy contact our office and allow 72 hours for refills to be completed.    Today you received the following chemotherapy and/or immunotherapy agents VENOFER      To help prevent nausea and vomiting after your treatment, we encourage you to take your nausea medication as directed.  BELOW ARE SYMPTOMS THAT SHOULD BE REPORTED IMMEDIATELY: *FEVER GREATER THAN 100.4 F (38 C) OR HIGHER *CHILLS OR SWEATING *NAUSEA AND VOMITING THAT IS NOT CONTROLLED WITH YOUR NAUSEA MEDICATION *UNUSUAL SHORTNESS OF BREATH *UNUSUAL BRUISING OR BLEEDING *URINARY PROBLEMS (pain or burning when urinating, or frequent urination) *BOWEL PROBLEMS (unusual diarrhea, constipation, pain near the anus) TENDERNESS IN MOUTH AND THROAT WITH OR WITHOUT PRESENCE OF ULCERS (sore throat, sores in mouth, or a toothache) UNUSUAL RASH, SWELLING OR PAIN  UNUSUAL VAGINAL DISCHARGE OR ITCHING   Items with * indicate a potential emergency and should be followed up as soon as possible or go to the Emergency Department if any problems should occur.  Please show the CHEMOTHERAPY ALERT CARD or IMMUNOTHERAPY ALERT CARD at check-in to  the Emergency Department and triage nurse.  Should you have questions after your visit or need to cancel or reschedule your appointment, please contact [facility]  [phone] and follow the prompts.  Office hours are 8:00 a.m. to 4:30 p.m. Monday - Friday. Please note that voicemails left after 4:00 p.m. may not be returned until the following business day.  We are closed weekends and major holidays. You have access to a nurse at all times for urgent questions. Please call the main number to the clinic [phone] and follow the prompts.  For any non-urgent questions, you may also contact your provider using MyChart. We now offer e-Visits for anyone 18 and older to request care online for non-urgent symptoms. For details visit [email or web address].   Also download the MyChart app! Go to the app store, search "MyChart", open the app, select [facility], and log in with your MyChart username and password.  Due to Covid, a mask is required upon entering the hospital/clinic. If you do not have a mask, one will be given to you upon arrival. For doctor visits, patients may have 1 support person aged 18 or older with them. For treatment visits, patients cannot have anyone with them due to current Covid guidelines and our immunocompromised population.   Iron Sucrose Injection What is this medication? IRON SUCROSE (EYE ern SOO krose) treats low levels of iron (iron deficiency anemia) in people with kidney disease. Iron is a mineral that plays an important role in making red blood cells, which carry oxygen from your lungs to the rest of your body. This medicine may   be used for other purposes; ask your health care provider or pharmacist if you have questions. COMMON BRAND NAME(S): Venofer What should I tell my care team before I take this medication? They need to know if you have any of these conditions: Anemia not caused by low iron levels Heart disease High levels  of iron in the blood Kidney disease Liver disease An unusual or allergic reaction to iron, other medications, foods, dyes, or preservatives Pregnant or trying to get pregnant Breast-feeding How should I use this medication? This medication is for infusion into a vein. It is given in a hospital or clinic setting. Talk to your care team about the use of this medication in children. While this medication may be prescribed for children as young as 2 years for selected conditions, precautions do apply. Overdosage: If you think you have taken too much of this medicine contact a poison control center or emergency room at once. NOTE: This medicine is only for you. Do not share this medicine with others. What if I miss a dose? It is important not to miss your dose. Call your care team if you are unable to keep an appointment. What may interact with this medication? Do not take this medication with any of the following: Deferoxamine Dimercaprol Other iron products This medication may also interact with the following: Chloramphenicol Deferasirox This list may not describe all possible interactions. Give your health care provider a list of all the medicines, herbs, non-prescription drugs, or dietary supplements you use. Also tell them if you smoke, drink alcohol, or use illegal drugs. Some items may interact with your medicine. What should I watch for while using this medication? Visit your care team regularly. Tell your care team if your symptoms do not start to get better or if they get worse. You may need blood work done while you are taking this medication. You may need to follow a special diet. Talk to your care team. Foods that contain iron include: whole grains/cereals, dried fruits, beans, or peas, leafy green vegetables, and organ meats (liver, kidney). What side effects may I notice from receiving this medication? Side effects that you should report to your care team as soon as  possible: Allergic reactions-skin rash, itching, hives, swelling of the face, lips, tongue, or throat Low blood pressure-dizziness, feeling faint or lightheaded, blurry vision Shortness of breath Side effects that usually do not require medical attention (report to your care team if they continue or are bothersome): Flushing Headache Joint pain Muscle pain Nausea Pain, redness, or irritation at injection site This list may not describe all possible side effects. Call your doctor for medical advice about side effects. You may report side effects to FDA at 1-800-FDA-1088. Where should I keep my medication? This medication is given in a hospital or clinic and will not be stored at home. NOTE: This sheet is a summary. It may not cover all possible information. If you have questions about this medicine, talk to your doctor, pharmacist, or health care provider.  2022 Elsevier/Gold Standard (2021-01-21 12:52:06)  

## 2021-09-05 NOTE — Progress Notes (Signed)
Patient here for follow up. Reports feeling nauseated a couple times this week.

## 2021-09-05 NOTE — Assessment & Plan Note (Signed)
#  Iron deficient anemia likely secondary to chronic blood loss/menorrhagia.  Hb- 9. S/p IV venofer- improved  # Today Hb- 9.8 overall stable proceed with IV venofer.   #Menorrhagia [uerine ]-s/p GYN evaluation- [place] aug 1st-2022 s/p D&C [Dr.jackson]- none at this time.   # DISPOSITION: # venofer today # follow up in 6 month MD; labs- cbc/urine pregnancy- possible venofer Dr.B

## 2021-09-10 ENCOUNTER — Encounter: Payer: Self-pay | Admitting: Internal Medicine

## 2022-03-06 ENCOUNTER — Inpatient Hospital Stay: Payer: 59

## 2022-03-06 ENCOUNTER — Inpatient Hospital Stay: Payer: 59 | Admitting: Internal Medicine

## 2022-03-06 ENCOUNTER — Inpatient Hospital Stay: Payer: 59 | Attending: Internal Medicine

## 2022-03-06 ENCOUNTER — Encounter: Payer: Self-pay | Admitting: Internal Medicine

## 2022-03-06 VITALS — BP 128/92 | HR 80

## 2022-03-06 DIAGNOSIS — N920 Excessive and frequent menstruation with regular cycle: Secondary | ICD-10-CM | POA: Insufficient documentation

## 2022-03-06 DIAGNOSIS — Z79899 Other long term (current) drug therapy: Secondary | ICD-10-CM | POA: Diagnosis not present

## 2022-03-06 DIAGNOSIS — D500 Iron deficiency anemia secondary to blood loss (chronic): Secondary | ICD-10-CM | POA: Diagnosis not present

## 2022-03-06 LAB — CBC WITH DIFFERENTIAL/PLATELET
Abs Immature Granulocytes: 0.01 10*3/uL (ref 0.00–0.07)
Basophils Absolute: 0 10*3/uL (ref 0.0–0.1)
Basophils Relative: 1 %
Eosinophils Absolute: 0.2 10*3/uL (ref 0.0–0.5)
Eosinophils Relative: 4 %
HCT: 32.2 % — ABNORMAL LOW (ref 36.0–46.0)
Hemoglobin: 10.5 g/dL — ABNORMAL LOW (ref 12.0–15.0)
Immature Granulocytes: 0 %
Lymphocytes Relative: 43 %
Lymphs Abs: 2.2 10*3/uL (ref 0.7–4.0)
MCH: 26.8 pg (ref 26.0–34.0)
MCHC: 32.6 g/dL (ref 30.0–36.0)
MCV: 82.1 fL (ref 80.0–100.0)
Monocytes Absolute: 0.3 10*3/uL (ref 0.1–1.0)
Monocytes Relative: 6 %
Neutro Abs: 2.4 10*3/uL (ref 1.7–7.7)
Neutrophils Relative %: 46 %
Platelets: 299 10*3/uL (ref 150–400)
RBC: 3.92 MIL/uL (ref 3.87–5.11)
RDW: 16.5 % — ABNORMAL HIGH (ref 11.5–15.5)
WBC: 5.1 10*3/uL (ref 4.0–10.5)
nRBC: 0 % (ref 0.0–0.2)

## 2022-03-06 LAB — PREGNANCY, URINE: Preg Test, Ur: NEGATIVE

## 2022-03-06 MED ORDER — SODIUM CHLORIDE 0.9 % IV SOLN
Freq: Once | INTRAVENOUS | Status: AC
  Filled 2022-03-06: qty 250

## 2022-03-06 MED ORDER — IRON SUCROSE 20 MG/ML IV SOLN
200.0000 mg | Freq: Once | INTRAVENOUS | Status: AC
  Administered 2022-03-06: 200 mg via INTRAVENOUS
  Filled 2022-03-06: qty 10

## 2022-03-06 MED ORDER — SODIUM CHLORIDE 0.9 % IV SOLN: 200.0000 mg | Freq: Once | INTRAVENOUS | Status: DC

## 2022-03-06 NOTE — Progress Notes (Signed)
[facility] ?CONSULT NOTE ? ?Patient Care Team: ?Joesphine Bare as PCP - General (Physician Assistant) ? ?CHIEF COMPLAINTS/PURPOSE OF CONSULTATION: ANEMIA ? ? ?HEMATOLOGY HISTORY: ? ?# IRON DEF ANEMIA-likely secondary menorrhagia [EGD/colonoscopy/capsule/Bone marrow Biopsy-NONE]; JAN hb 9 [PCP] s/p Venofer.  ? ?#Gynecology-June 09, 2021 ultrasound pending-?  Fibroids; S/p evaluation with gynecology-question fibroids -patient recently underwent dilation curettage. ? ?HISTORY OF PRESENTING ILLNESS: Alone.  Ambulating independently. ? ?Christina Ellis 31 y.o.  female with iron deficiency anemia secondary to heavy menstrual cycles is here for follow-up. ? ?Patient not on oral iron given history of constipation. Noticed to have improvement of her menstrual cycles.  Continues to complain of mild to moderate fatigue. ? ? ?Review of Systems  ?Constitutional:  Positive for malaise/fatigue. Negative for chills, diaphoresis, fever and weight loss.  ?HENT:  Negative for nosebleeds and sore throat.   ?Eyes:  Negative for double vision.  ?Respiratory:  Negative for cough, hemoptysis, sputum production, shortness of breath and wheezing.   ?Cardiovascular:  Negative for chest pain, palpitations, orthopnea and leg swelling.  ?Gastrointestinal:  Negative for abdominal pain, blood in stool, constipation, diarrhea, heartburn, melena, nausea and vomiting.  ?Genitourinary:  Negative for dysuria, frequency and urgency.  ?Musculoskeletal:  Negative for back pain and joint pain.  ?Skin: Negative.  Negative for itching and rash.  ?Neurological:  Positive for dizziness. Negative for tingling, focal weakness, weakness and headaches.  ?Endo/Heme/Allergies:  Does not bruise/bleed easily.  ?Psychiatric/Behavioral:  Negative for depression. The patient is not nervous/anxious and does not have insomnia.   ? ?MEDICAL HISTORY:  ?Past Medical History:  ?Diagnosis Date  ?? Anemia   ? ? ?SURGICAL HISTORY: ?Past Surgical  History:  ?Procedure Laterality Date  ?? DILATATION & CURETTAGE/HYSTEROSCOPY WITH MYOSURE N/A 08/12/2021  ? Procedure: DILATATION & CURETTAGE/HYSTEROSCOPY WITH MYOSURE with submucosal fibroidectomy and endometrial polypectomy;  Surgeon: Will Bonnet, MD;  Location: [facility] ORS;  Service: Gynecology;  Laterality: N/A;  ?? NO PAST SURGERIES    ? ? ?SOCIAL HISTORY: ?Social History  ? ?Socioeconomic History  ?? Marital status: Single  ?  Spouse name: Not on file  ?? Number of children: Not on file  ?? Years of education: Not on file  ?? Highest education level: Not on file  ?Occupational History  ?? Not on file  ?Tobacco Use  ?? Smoking status: Never  ?? Smokeless tobacco: Never  ?Vaping Use  ?? Vaping Use: Never used  ?Substance and Sexual Activity  ?? Alcohol use: No  ?  Comment: occasional glass of wine  ?? Drug use: Never  ?? Sexual activity: Not Currently  ?  Birth control/protection: None  ?Other Topics Concern  ?? Not on file  ?Social History Narrative  ? 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 Lives in [place]; with mom.   ? ?Social Determinants of Health  ? ?Financial Resource Strain: Not on file  ?Food Insecurity: Not on file  ?Transportation Needs: Not on file  ?Physical Activity: Not on file  ?Stress: Not on file  ?Social Connections: Not on file  ?Intimate Partner Violence: Not on file  ? ? ?FAMILY HISTORY: ?Family History  ?Problem Relation Age of Onset  ?? Anemia Mother   ?? Thyroid cancer Maternal Grandmother   ?? Pancreatic cancer Maternal Grandfather   ? ? ?ALLERGIES:  is allergic to latex. ? ?MEDICATIONS:  ?Current Outpatient Medications  ?Medication Sig Dispense Refill  ?? ferrous sulfate 325 (65 FE) MG tablet Take 1 tablet by mouth daily  with breakfast. (Patient not taking: Reported on 09/05/2021)    ?? ibuprofen (ADVIL) 600 MG tablet Take 1 tablet (600 mg total) by mouth every 6 (six) hours as needed for mild pain or cramping. (Patient not taking: Reported on 08/25/2021) 30  tablet 0  ?? Omega 3-6-9 Fatty Acids (OMEGA-3 FUSION) LIQD Take 5 mLs by mouth daily. (Patient not taking: Reported on 08/25/2021)    ? ?No current facility-administered medications for this visit.  ? ? ? ? ?PHYSICAL EXAMINATION: ? ? ?Vitals:  ? 03/06/22 1305  ?BP: 124/81  ?Pulse: 82  ?Temp: 98.8 ?F (37.1 ?C)  ?SpO2: 100%  ? ?Filed Weights  ? 03/06/22 1305  ?Weight: 245 lb 12.8 oz (111.5 kg)  ? ? ?Physical Exam ?Constitutional:   ?   Comments: Ambulating; with mother.   ?HENT:  ?   Head: Normocephalic and atraumatic.  ?   Mouth/Throat:  ?   Pharynx: No oropharyngeal exudate.  ?Eyes:  ?   Pupils: Pupils are equal, round, and reactive to light.  ?Cardiovascular:  ?   Rate and Rhythm: Normal rate and regular rhythm.  ?Pulmonary:  ?   Effort: Pulmonary effort is normal. No respiratory distress.  ?   Breath sounds: Normal breath sounds. No wheezing.  ?Abdominal:  ?   General: Bowel sounds are normal. There is no distension.  ?   Palpations: Abdomen is soft. There is no mass.  ?   Tenderness: There is no abdominal tenderness. There is no guarding or rebound.  ?Musculoskeletal:     ?   General: No tenderness. Normal range of motion.  ?   Cervical back: Normal range of motion and neck supple.  ?Skin: ?   General: Skin is warm.  ?Neurological:  ?   Mental Status: She is alert and oriented to person, place, and time.  ?Psychiatric:     ?   Mood and Affect: Affect normal.  ? ? ?LABORATORY DATA:  ?I have reviewed the data as listed ?Lab Results  ?Component Value Date  ? WBC 5.1 03/06/2022  ? HGB 10.5 (L) 03/06/2022  ? HCT 32.2 (L) 03/06/2022  ? MCV 82.1 03/06/2022  ? PLT 299 03/06/2022  ? ?No results for input(s): NA, K, CL, CO2, GLUCOSE, BUN, CREATININE, CALCIUM, GFRNONAA, GFRAA, PROT, ALBUMIN, AST, ALT, ALKPHOS, BILITOT, BILIDIR, IBILI in the last 8760 hours. ? ? ?No results found. ? ? ?Iron deficiency anemia due to chronic blood loss ?#Iron deficient anemia likely secondary to chronic blood loss/menorrhagia.  ? ?# Today Hb-  10.5;  overall stable proceed with IV venofer. #Recommend gentle iron 1 pill a day; discussed that this should not cause stomach distress or cause constipation; over-the-counter.  ? ?#Menorrhagia [uerine ]-s/p GYN evaluation- [place] aug 1st-2022 s/p D&C [Dr.jackson]- Imrpved; not resolved.  ? ?# DISPOSITION: ?# venofer today ?# venofer in 1 week ?# follow up in 6 month MD; labs- cbc/urine pregnancy- possible venofer Dr.B ? ? ?All questions were answered. The patient knows to call the clinic with any problems, questions or concerns. ?  ? [name], MD ?03/06/2022 2:10 PM ? ? ?

## 2022-03-06 NOTE — Assessment & Plan Note (Addendum)
#  Iron deficient anemia likely secondary to chronic blood loss/menorrhagia.  ? ?# Today Hb- 10.5;  overall stable proceed with IV venofer. #Recommend gentle iron 1 pill a day; discussed that this should not cause stomach distress or cause constipation; over-the-counter.  ? ?#Menorrhagia [uerine ]-s/p GYN evaluation- [place] aug 1st-2022 s/p D&C [Dr.jackson]- Imrpved; not resolved.  ? ?# DISPOSITION: ?# venofer today ?# venofer in 1 week ?# follow up in 6 month MD; labs- cbc/urine pregnancy- possible venofer Dr.B ?

## 2022-03-10 ENCOUNTER — Inpatient Hospital Stay: Payer: 59 | Attending: Internal Medicine

## 2022-03-10 VITALS — BP 125/83 | HR 69 | Temp 97.0°F | Resp 18

## 2022-03-10 DIAGNOSIS — D500 Iron deficiency anemia secondary to blood loss (chronic): Secondary | ICD-10-CM | POA: Insufficient documentation

## 2022-03-10 DIAGNOSIS — N920 Excessive and frequent menstruation with regular cycle: Secondary | ICD-10-CM | POA: Insufficient documentation

## 2022-03-10 MED ORDER — IRON SUCROSE 20 MG/ML IV SOLN
200.0000 mg | Freq: Once | INTRAVENOUS | Status: AC
  Administered 2022-03-10: 200 mg via INTRAVENOUS
  Filled 2022-03-10: qty 10

## 2022-03-10 MED ORDER — SODIUM CHLORIDE 0.9 % IV SOLN
Freq: Once | INTRAVENOUS | Status: AC
  Filled 2022-03-10: qty 250

## 2022-03-10 MED ORDER — SODIUM CHLORIDE 0.9 % IV SOLN: 200.0000 mg | Freq: Once | INTRAVENOUS | Status: DC

## 2022-06-22 DIAGNOSIS — H60501 Unspecified acute noninfective otitis externa, right ear: Secondary | ICD-10-CM | POA: Diagnosis not present

## 2022-06-24 DIAGNOSIS — N840 Polyp of corpus uteri: Secondary | ICD-10-CM | POA: Diagnosis not present

## 2022-06-24 DIAGNOSIS — Z0000 Encounter for general adult medical examination without abnormal findings: Secondary | ICD-10-CM | POA: Diagnosis not present

## 2022-06-24 DIAGNOSIS — D251 Intramural leiomyoma of uterus: Secondary | ICD-10-CM | POA: Diagnosis not present

## 2022-06-24 DIAGNOSIS — Z1331 Encounter for screening for depression: Secondary | ICD-10-CM | POA: Diagnosis not present

## 2022-06-24 DIAGNOSIS — D500 Iron deficiency anemia secondary to blood loss (chronic): Secondary | ICD-10-CM | POA: Diagnosis not present

## 2022-06-24 DIAGNOSIS — D250 Submucous leiomyoma of uterus: Secondary | ICD-10-CM | POA: Diagnosis not present

## 2022-06-24 DIAGNOSIS — D252 Subserosal leiomyoma of uterus: Secondary | ICD-10-CM | POA: Diagnosis not present

## 2022-06-24 DIAGNOSIS — E119 Type 2 diabetes mellitus without complications: Secondary | ICD-10-CM | POA: Diagnosis not present

## 2022-06-24 DIAGNOSIS — N920 Excessive and frequent menstruation with regular cycle: Secondary | ICD-10-CM | POA: Diagnosis not present

## 2022-09-04 ENCOUNTER — Ambulatory Visit: Payer: 59

## 2022-09-04 ENCOUNTER — Ambulatory Visit: Payer: 59 | Admitting: Internal Medicine

## 2022-09-04 ENCOUNTER — Other Ambulatory Visit: Payer: 59

## 2022-10-20 ENCOUNTER — Inpatient Hospital Stay: Payer: 59 | Attending: Internal Medicine

## 2022-10-20 ENCOUNTER — Inpatient Hospital Stay (HOSPITAL_BASED_OUTPATIENT_CLINIC_OR_DEPARTMENT_OTHER): Payer: 59 | Admitting: Internal Medicine

## 2022-10-20 ENCOUNTER — Inpatient Hospital Stay: Payer: 59

## 2022-10-20 ENCOUNTER — Encounter: Payer: Self-pay | Admitting: Internal Medicine

## 2022-10-20 VITALS — BP 132/88 | HR 83 | Temp 99.0°F | Resp 16 | Wt 251.0 lb

## 2022-10-20 DIAGNOSIS — D500 Iron deficiency anemia secondary to blood loss (chronic): Secondary | ICD-10-CM

## 2022-10-20 DIAGNOSIS — N920 Excessive and frequent menstruation with regular cycle: Secondary | ICD-10-CM | POA: Diagnosis not present

## 2022-10-20 DIAGNOSIS — R5383 Other fatigue: Secondary | ICD-10-CM | POA: Insufficient documentation

## 2022-10-20 LAB — CBC WITH DIFFERENTIAL/PLATELET
Abs Immature Granulocytes: 0.01 10*3/uL (ref 0.00–0.07)
Basophils Absolute: 0 10*3/uL (ref 0.0–0.1)
Basophils Relative: 1 %
Eosinophils Absolute: 0.1 10*3/uL (ref 0.0–0.5)
Eosinophils Relative: 2 %
HCT: 35.7 % — ABNORMAL LOW (ref 36.0–46.0)
Hemoglobin: 11.5 g/dL — ABNORMAL LOW (ref 12.0–15.0)
Immature Granulocytes: 0 %
Lymphocytes Relative: 40 %
Lymphs Abs: 2.5 10*3/uL (ref 0.7–4.0)
MCH: 27.6 pg (ref 26.0–34.0)
MCHC: 32.2 g/dL (ref 30.0–36.0)
MCV: 85.8 fL (ref 80.0–100.0)
Monocytes Absolute: 0.4 10*3/uL (ref 0.1–1.0)
Monocytes Relative: 7 %
Neutro Abs: 3.2 10*3/uL (ref 1.7–7.7)
Neutrophils Relative %: 50 %
Platelets: 282 10*3/uL (ref 150–400)
RBC: 4.16 MIL/uL (ref 3.87–5.11)
RDW: 13.6 % (ref 11.5–15.5)
WBC: 6.2 10*3/uL (ref 4.0–10.5)
nRBC: 0 % (ref 0.0–0.2)

## 2022-10-20 LAB — PREGNANCY, URINE: Preg Test, Ur: NEGATIVE

## 2022-10-20 MED ORDER — SODIUM CHLORIDE 0.9 % IV SOLN
200.0000 mg | Freq: Once | INTRAVENOUS | Status: AC
  Administered 2022-10-20: 200 mg via INTRAVENOUS
  Filled 2022-10-20: qty 200

## 2022-10-20 MED ORDER — SODIUM CHLORIDE 0.9 % IV SOLN
Freq: Once | INTRAVENOUS | Status: AC
  Filled 2022-10-20: qty 250

## 2022-10-20 NOTE — Progress Notes (Signed)
Not taking oral iron consistently.

## 2022-10-20 NOTE — Progress Notes (Signed)
[facility] CONSULT NOTE  Patient Care Team: [name], PA-C as PCP - General (Physician Assistant) [name], MD as Consulting Physician (Internal Medicine)  CHIEF COMPLAINTS/PURPOSE OF CONSULTATION: ANEMIA   HEMATOLOGY HISTORY:  # IRON DEF ANEMIA-likely secondary menorrhagia [EGD/colonoscopy/capsule/Bone marrow Biopsy-NONE]; JAN hb 9 [PCP] s/p Venofer.   #Gynecology-June 09, 2021 ultrasound pending-?  Fibroids; S/p evaluation with gynecology-question fibroids -patient recently underwent dilation curettage.  HISTORY OF PRESENTING ILLNESS: Alone.  Ambulating independently.  Christina Ellis 31 y.o.  female with iron deficiency anemia secondary to heavy menstrual cycles is here for follow-up.  Not taking oral iron consistently. Patient not on oral iron given history of constipation.  Was not able to get gentle iron.  Noticed to have improvement of her menstrual cycles since myomectomy.  Continues to complain of mild to moderate fatigue.  Review of Systems  Constitutional:  Positive for malaise/fatigue. Negative for chills, diaphoresis, fever and weight loss.  HENT:  Negative for nosebleeds and sore throat.   Eyes:  Negative for double vision.  Respiratory:  Negative for cough, hemoptysis, sputum production, shortness of breath and wheezing.   Cardiovascular:  Negative for chest pain, palpitations, orthopnea and leg swelling.  Gastrointestinal:  Negative for abdominal pain, blood in stool, constipation, diarrhea, heartburn, melena, nausea and vomiting.  Genitourinary:  Negative for dysuria, frequency and urgency.  Musculoskeletal:  Negative for back pain and joint pain.  Skin: Negative.  Negative for itching and rash.  Neurological:  Positive for dizziness. Negative for tingling, focal weakness, weakness and headaches.  Endo/Heme/Allergies:  Does not bruise/bleed easily.  Psychiatric/Behavioral:  Negative for depression. The patient is not  nervous/anxious and does not have insomnia.     MEDICAL HISTORY:  Past Medical History:  Diagnosis Date   Anemia     SURGICAL HISTORY: Past Surgical History:  Procedure Laterality Date   DILATATION & CURETTAGE/HYSTEROSCOPY WITH MYOSURE N/A 08/12/2021   Procedure: DILATATION & CURETTAGE/HYSTEROSCOPY WITH MYOSURE with submucosal fibroidectomy and endometrial polypectomy;  Surgeon: Will Bonnet, MD;  Location: [facility] ORS;  Service: Gynecology;  Laterality: N/A;   NO PAST SURGERIES      SOCIAL HISTORY: Social History   Socioeconomic History   Marital status: Single    Spouse name: Not on file   Number of children: Not on file   Years of education: Not on file   Highest education level: Not on file  Occupational History   Not on file  Tobacco Use   Smoking status: Never   Smokeless tobacco: Never  Vaping Use   Vaping Use: Never used  Substance and Sexual Activity   Alcohol use: No    Comment: occasional glass of wine   Drug use: Never   Sexual activity: Not Currently    Birth control/protection: None  Other Topics Concern   Not on file  Social History Narrative   Emergency dept; 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    Social Determinants of Health   Financial Resource Strain: Not on file  Food Insecurity: Not on file  Transportation Needs: Not on file  Physical Activity: Not on file  Stress: Not on file  Social Connections: Not on file  Intimate Partner Violence: Not on file    FAMILY HISTORY: Family History  Problem Relation Age of Onset   Anemia Mother    Thyroid cancer Maternal Grandmother    Pancreatic cancer Maternal Grandfather     ALLERGIES:  is allergic to latex.  MEDICATIONS:  Current Outpatient Medications  Medication Sig Dispense Refill   Cholecalciferol (VITAMIN D3) 10 MCG (400 UNIT) tablet 800 Units daily.     ibuprofen (ADVIL) 600 MG tablet Take 1 tablet (600 mg total) by mouth every 6 (six) hours as  needed for mild pain or cramping. 30 tablet 0   Omega 3-6-9 Fatty Acids (OMEGA-3 FUSION) LIQD Take 5 mLs by mouth daily.     ferrous sulfate 325 (65 FE) MG tablet Take 1 tablet by mouth daily with breakfast. (Patient not taking: Reported on 09/05/2021)     No current facility-administered medications for this visit.      PHYSICAL EXAMINATION:   Vitals:   10/20/22 1300  BP: 132/88  Pulse: 83  Resp: 16  Temp: 99 F (37.2 C)   Filed Weights   10/20/22 1300  Weight: 251 lb (113.9 kg)    Physical Exam Constitutional:      Comments: Ambulating; with mother.   HENT:     Head: Normocephalic and atraumatic.     Mouth/Throat:     Pharynx: No oropharyngeal exudate.  Eyes:     Pupils: Pupils are equal, round, and reactive to light.  Cardiovascular:     Rate and Rhythm: Normal rate and regular rhythm.  Pulmonary:     Effort: Pulmonary effort is normal. No respiratory distress.     Breath sounds: Normal breath sounds. No wheezing.  Abdominal:     General: Bowel sounds are normal. There is no distension.     Palpations: Abdomen is soft. There is no mass.     Tenderness: There is no abdominal tenderness. There is no guarding or rebound.  Musculoskeletal:        General: No tenderness. Normal range of motion.     Cervical back: Normal range of motion and neck supple.  Skin:    General: Skin is warm.  Neurological:     Mental Status: She is alert and oriented to person, place, and time.  Psychiatric:        Mood and Affect: Affect normal.     LABORATORY DATA:  I have reviewed the data as listed Lab Results  Component Value Date   WBC 6.2 10/20/2022   HGB 11.5 (L) 10/20/2022   HCT 35.7 (L) 10/20/2022   MCV 85.8 10/20/2022   PLT 282 10/20/2022   No results for input(s): "NA", "K", "CL", "CO2", "GLUCOSE", "BUN", "CREATININE", "CALCIUM", "GFRNONAA", "GFRAA", "PROT", "ALBUMIN", "AST", "ALT", "ALKPHOS", "BILITOT", "BILIDIR", "IBILI" in the last 8760 hours.   No results  found.   Iron deficiency anemia due to chronic blood loss #Iron deficient anemia likely secondary to chronic blood loss/menorrhagia.   # Today Hb- 11.2; AUG 2023-  Ferritin-9; LOW-  proceed with IV venofer. #Recommend switching over to gentle iron 1 pill a day; discussed that this should not cause stomach distress or cause constipation; over-the-counter.   #Menorrhagia [uerine ]-s/p GYN evaluation- [place] aug 1st-2022 s/p D&C [Dr.jackson]- Imprpved; not resolved.   # DISPOSITION: # venofer today # venofer in 1 week # follow up in 6 month MD; labs- cbc/bmp; iron studies; ferritin/urine pregnancy- possible venofer Dr.B   All questions were answered. The patient knows to call the clinic with any problems, questions or concerns.    [name], MD 10/20/2022 1:46 PM

## 2022-10-20 NOTE — Assessment & Plan Note (Addendum)
#  Iron deficient anemia likely secondary to chronic blood loss/menorrhagia.   # Today Hb- 11.2; AUG 2023-  Ferritin-9; LOW-  proceed with IV venofer. #Recommend switching over to gentle iron 1 pill a day; discussed that this should not cause stomach distress or cause constipation; over-the-counter.   #Menorrhagia [uerine ]-s/p GYN evaluation- [place] aug 1st-2022 s/p D&C [Dr.jackson]- Imprpved; not resolved.   # DISPOSITION: # venofer today # venofer in 1 week # follow up in 6 month MD; labs- cbc/bmp; iron studies; ferritin/urine pregnancy- possible venofer Dr.B

## 2022-10-22 ENCOUNTER — Encounter: Payer: Self-pay | Admitting: Internal Medicine

## 2022-10-24 ENCOUNTER — Encounter: Payer: Self-pay | Admitting: Internal Medicine

## 2022-10-29 ENCOUNTER — Inpatient Hospital Stay: Payer: 59

## 2022-10-29 VITALS — BP 123/78 | HR 69 | Temp 97.9°F | Resp 18

## 2022-10-29 DIAGNOSIS — N920 Excessive and frequent menstruation with regular cycle: Secondary | ICD-10-CM | POA: Diagnosis not present

## 2022-10-29 DIAGNOSIS — D500 Iron deficiency anemia secondary to blood loss (chronic): Secondary | ICD-10-CM

## 2022-10-29 DIAGNOSIS — R5383 Other fatigue: Secondary | ICD-10-CM | POA: Diagnosis not present

## 2022-10-29 MED ORDER — SODIUM CHLORIDE 0.9 % IV SOLN
200.0000 mg | Freq: Once | INTRAVENOUS | Status: AC
  Administered 2022-10-29: 200 mg via INTRAVENOUS
  Filled 2022-10-29: qty 200

## 2022-10-29 MED ORDER — SODIUM CHLORIDE 0.9 % IV SOLN
Freq: Once | INTRAVENOUS | Status: AC
  Filled 2022-10-29: qty 250

## 2022-10-29 NOTE — Patient Instructions (Signed)

## 2023-04-20 ENCOUNTER — Other Ambulatory Visit: Payer: Self-pay | Admitting: *Deleted

## 2023-04-20 DIAGNOSIS — D500 Iron deficiency anemia secondary to blood loss (chronic): Secondary | ICD-10-CM

## 2023-04-21 ENCOUNTER — Encounter: Payer: Self-pay | Admitting: Internal Medicine

## 2023-04-21 ENCOUNTER — Other Ambulatory Visit: Payer: Self-pay

## 2023-04-21 ENCOUNTER — Inpatient Hospital Stay (HOSPITAL_BASED_OUTPATIENT_CLINIC_OR_DEPARTMENT_OTHER): Payer: Commercial Managed Care - PPO | Admitting: Internal Medicine

## 2023-04-21 ENCOUNTER — Inpatient Hospital Stay: Payer: Commercial Managed Care - PPO | Attending: Internal Medicine

## 2023-04-21 ENCOUNTER — Inpatient Hospital Stay: Payer: Commercial Managed Care - PPO

## 2023-04-21 VITALS — BP 123/88 | HR 71 | Temp 96.1°F | Ht 67.0 in | Wt 241.8 lb

## 2023-04-21 DIAGNOSIS — N920 Excessive and frequent menstruation with regular cycle: Secondary | ICD-10-CM | POA: Diagnosis not present

## 2023-04-21 DIAGNOSIS — D500 Iron deficiency anemia secondary to blood loss (chronic): Secondary | ICD-10-CM

## 2023-04-21 LAB — CBC WITH DIFFERENTIAL (CANCER CENTER ONLY)
Abs Immature Granulocytes: 0.01 10*3/uL (ref 0.00–0.07)
Basophils Absolute: 0 10*3/uL (ref 0.0–0.1)
Basophils Relative: 1 %
Eosinophils Absolute: 0.2 10*3/uL (ref 0.0–0.5)
Eosinophils Relative: 4 %
HCT: 32.3 % — ABNORMAL LOW (ref 36.0–46.0)
Hemoglobin: 10.5 g/dL — ABNORMAL LOW (ref 12.0–15.0)
Immature Granulocytes: 0 %
Lymphocytes Relative: 45 %
Lymphs Abs: 2.8 10*3/uL (ref 0.7–4.0)
MCH: 27.3 pg (ref 26.0–34.0)
MCHC: 32.5 g/dL (ref 30.0–36.0)
MCV: 84.1 fL (ref 80.0–100.0)
Monocytes Absolute: 0.5 10*3/uL (ref 0.1–1.0)
Monocytes Relative: 8 %
Neutro Abs: 2.6 10*3/uL (ref 1.7–7.7)
Neutrophils Relative %: 42 %
Platelet Count: 350 10*3/uL (ref 150–400)
RBC: 3.84 MIL/uL — ABNORMAL LOW (ref 3.87–5.11)
RDW: 13.8 % (ref 11.5–15.5)
WBC Count: 6.2 10*3/uL (ref 4.0–10.5)
nRBC: 0 % (ref 0.0–0.2)

## 2023-04-21 LAB — BASIC METABOLIC PANEL - CANCER CENTER ONLY
Anion gap: 9 (ref 5–15)
BUN: 11 mg/dL (ref 6–20)
CO2: 24 mmol/L (ref 22–32)
Calcium: 9.1 mg/dL (ref 8.9–10.3)
Chloride: 105 mmol/L (ref 98–111)
Creatinine: 0.91 mg/dL (ref 0.44–1.00)
GFR, Estimated: >60 mL/min (ref >60)
Glucose, Bld: 106 mg/dL — ABNORMAL HIGH (ref 70–99)
Potassium: 3.9 mmol/L (ref 3.5–5.1)
Sodium: 138 mmol/L (ref 135–145)

## 2023-04-21 LAB — PREGNANCY, URINE: Preg Test, Ur: NEGATIVE

## 2023-04-21 LAB — FERRITIN: Ferritin: 6 ng/mL — ABNORMAL LOW (ref 11–307)

## 2023-04-21 MED ORDER — SODIUM CHLORIDE 0.9 % IV SOLN
200.0000 mg | Freq: Once | INTRAVENOUS | Status: AC
  Administered 2023-04-21: 200 mg via INTRAVENOUS
  Filled 2023-04-21: qty 200

## 2023-04-21 MED ORDER — SODIUM CHLORIDE 0.9 % IV SOLN
Freq: Once | INTRAVENOUS | Status: AC
  Filled 2023-04-21: qty 250

## 2023-04-21 NOTE — Assessment & Plan Note (Addendum)
#  Iron deficient anemia likely secondary to chronic blood loss/menorrhagia.   # AUG 2023-  Ferritin-9; LOW-  proceed with IV venofer.again reminded the pat to statr gentle iron 1 pill a day; discussed that this should not cause stomach distress or cause constipation; over-the-counter. Pending iron studies/ ferritin today. Today Hb- 10  proceed with venofer  #Menorrhagia [uerine ]-s/p GYN evaluation- [place] aug 1st-2022 s/p D&C [Dr.jackson]- Imprpved; not resolved.   # DISPOSITION: # venofer today # venofer in 1 week # follow up in 6 month MD; labs- cbc/bmp; iron studies; ferritin/urine pregnancy- possible venofer Dr.B

## 2023-04-21 NOTE — Patient Instructions (Signed)
[facility]  Discharge Instructions: Thank you for choosing [facility] to provide your oncology and hematology care.  If you have a lab appointment with the Cancer Center, please go directly to the Cancer Center and check in at the registration area.  Wear comfortable clothing and clothing appropriate for easy access to any Portacath or PICC line.   We strive to give you quality time with your provider. You may need to reschedule your appointment if you arrive late (15 or more minutes).  Arriving late affects you and other patients whose appointments are after yours.  Also, if you miss three or more appointments without notifying the office, you may be dismissed from the clinic at the provider's discretion.      For prescription refill requests, have your pharmacy contact our office and allow 72 hours for refills to be completed.    Today you received the following chemotherapy and/or immunotherapy agents Venofer.      To help prevent nausea and vomiting after your treatment, we encourage you to take your nausea medication as directed.  BELOW ARE SYMPTOMS THAT SHOULD BE REPORTED IMMEDIATELY: *FEVER GREATER THAN 100.4 F (38 C) OR HIGHER *CHILLS OR SWEATING *NAUSEA AND VOMITING THAT IS NOT CONTROLLED WITH YOUR NAUSEA MEDICATION *UNUSUAL SHORTNESS OF BREATH *UNUSUAL BRUISING OR BLEEDING *URINARY PROBLEMS (pain or burning when urinating, or frequent urination) *BOWEL PROBLEMS (unusual diarrhea, constipation, pain near the anus) TENDERNESS IN MOUTH AND THROAT WITH OR WITHOUT PRESENCE OF ULCERS (sore throat, sores in mouth, or a toothache) UNUSUAL RASH, SWELLING OR PAIN  UNUSUAL VAGINAL DISCHARGE OR ITCHING   Items with * indicate a potential emergency and should be followed up as soon as possible or go to the Emergency Department if any problems should occur.  Please show the CHEMOTHERAPY ALERT CARD or IMMUNOTHERAPY ALERT CARD at check-in to  the Emergency Department and triage nurse.  Should you have questions after your visit or need to cancel or reschedule your appointment, please contact [facility]  [phone] and follow the prompts.  Office hours are 8:00 a.m. to 4:30 p.m. Monday - Friday. Please note that voicemails left after 4:00 p.m. may not be returned until the following business day.  We are closed weekends and major holidays. You have access to a nurse at all times for urgent questions. Please call the main number to the clinic [phone] and follow the prompts.  For any non-urgent questions, you may also contact your provider using MyChart. We now offer e-Visits for anyone 18 and older to request care online for non-urgent symptoms. For details visit [email or web address].   Also download the MyChart app! Go to the app store, search "MyChart", open the app, select [facility], and log in with your MyChart username and password.   

## 2023-04-21 NOTE — Progress Notes (Signed)
[facility] CONSULT NOTE  Patient Care Team: Carren Rang, PA-C as PCP - General (Physician Assistant) Earna Coder, MD as Consulting Physician (Internal Medicine)  CHIEF COMPLAINTS/PURPOSE OF CONSULTATION: ANEMIA   HEMATOLOGY HISTORY:  # IRON DEF ANEMIA-likely secondary menorrhagia [EGD/colonoscopy/capsule/Bone marrow Biopsy-NONE]; JAN hb 9 [PCP] s/p Venofer.   #Gynecology-June 09, 2021 ultrasound pending-?  Fibroids; S/p evaluation with gynecology-question fibroids -patient recently underwent dilation curettage.  HISTORY OF PRESENTING ILLNESS: Alone.  Ambulating independently.  Christina Ellis 32 y.o.  female with iron deficiency anemia secondary to heavy menstrual cycles is here for follow-up.  Not taking oral iron consistently. Patient not on oral iron given history of constipation.   Noticed to have improvement of her menstrual cycles since myomectomy.  Continues to complain of mild to moderate fatigue.  Review of Systems  Constitutional:  Positive for malaise/fatigue. Negative for chills, diaphoresis, fever and weight loss.  HENT:  Negative for nosebleeds and sore throat.   Eyes:  Negative for double vision.  Respiratory:  Negative for cough, hemoptysis, sputum production, shortness of breath and wheezing.   Cardiovascular:  Negative for chest pain, palpitations, orthopnea and leg swelling.  Gastrointestinal:  Negative for abdominal pain, blood in stool, constipation, diarrhea, heartburn, melena, nausea and vomiting.  Genitourinary:  Negative for dysuria, frequency and urgency.  Musculoskeletal:  Negative for back pain and joint pain.  Skin: Negative.  Negative for itching and rash.  Neurological:  Negative for tingling, focal weakness, weakness and headaches.  Endo/Heme/Allergies:  Does not bruise/bleed easily.  Psychiatric/Behavioral:  Negative for depression. The patient is not nervous/anxious and does not have insomnia.     MEDICAL  HISTORY:  Past Medical History:  Diagnosis Date   Anemia     SURGICAL HISTORY: Past Surgical History:  Procedure Laterality Date   DILATATION & CURETTAGE/HYSTEROSCOPY WITH MYOSURE N/A 08/12/2021   Procedure: DILATATION & CURETTAGE/HYSTEROSCOPY WITH MYOSURE with submucosal fibroidectomy and endometrial polypectomy;  Surgeon: Conard Novak, MD;  Location: [facility] ORS;  Service: Gynecology;  Laterality: N/A;   NO PAST SURGERIES      SOCIAL HISTORY: Social History   Socioeconomic History   Marital status: Single    Spouse name: Not on file   Number of children: Not on file   Years of education: Not on file   Highest education level: Not on file  Occupational History   Not on file  Tobacco Use   Smoking status: Never   Smokeless tobacco: Never  Vaping Use   Vaping Use: Never used  Substance and Sexual Activity   Alcohol use: No    Comment: occasional glass of wine   Drug use: Never   Sexual activity: Not Currently    Birth control/protection: None  Other Topics Concern   Not on file  Social History Narrative   Emergency dept; 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 Non-smoker; rare alcohol. Lives in [place]; with mom.    Social Determinants of Health   Financial Resource Strain: Not on file  Food Insecurity: Not on file  Transportation Needs: Not on file  Physical Activity: Not on file  Stress: Not on file  Social Connections: Not on file  Intimate Partner Violence: Not on file    FAMILY HISTORY: Family History  Problem Relation Age of Onset   Anemia Mother    Thyroid cancer Maternal Grandmother    Pancreatic cancer Maternal Grandfather     ALLERGIES:  is allergic to latex.  MEDICATIONS:  Current Outpatient Medications  Medication Sig Dispense Refill  Cholecalciferol (VITAMIN D3) 10 MCG (400 UNIT) tablet 800 Units daily.     ibuprofen (ADVIL) 600 MG tablet Take 1 tablet (600 mg total) by mouth every 6 (six) hours as needed for mild pain or cramping. 30 tablet 0   ferrous  sulfate 325 (65 FE) MG tablet Take 1 tablet by mouth daily with breakfast. (Patient not taking: Reported on 09/05/2021)     Omega 3-6-9 Fatty Acids (OMEGA-3 FUSION) LIQD Take 5 mLs by mouth daily. (Patient not taking: Reported on 04/21/2023)     No current facility-administered medications for this visit.      PHYSICAL EXAMINATION:   Vitals:   04/21/23 1256  BP: 123/88  Pulse: 71  Temp: (!) 96.1 F (35.6 C)  SpO2: 98%   Filed Weights   04/21/23 1256  Weight: 241 lb 12.8 oz (109.7 kg)    Physical Exam Constitutional:      Comments: Ambulating; with mother.   HENT:     Head: Normocephalic and atraumatic.     Mouth/Throat:     Pharynx: No oropharyngeal exudate.  Eyes:     Pupils: Pupils are equal, round, and reactive to light.  Cardiovascular:     Rate and Rhythm: Normal rate and regular rhythm.  Pulmonary:     Effort: Pulmonary effort is normal. No respiratory distress.     Breath sounds: Normal breath sounds. No wheezing.  Abdominal:     General: Bowel sounds are normal. There is no distension.     Palpations: Abdomen is soft. There is no mass.     Tenderness: There is no abdominal tenderness. There is no guarding or rebound.  Musculoskeletal:        General: No tenderness. Normal range of motion.     Cervical back: Normal range of motion and neck supple.  Skin:    General: Skin is warm.  Neurological:     Mental Status: She is alert and oriented to person, place, and time.  Psychiatric:        Mood and Affect: Affect normal.     LABORATORY DATA:  I have reviewed the data as listed Lab Results  Component Value Date   WBC 6.2 04/21/2023   HGB 10.5 (L) 04/21/2023   HCT 32.3 (L) 04/21/2023   MCV 84.1 04/21/2023   PLT 350 04/21/2023   No results for input(s): "NA", "K", "CL", "CO2", "GLUCOSE", "BUN", "CREATININE", "CALCIUM", "GFRNONAA", "GFRAA", "PROT", "ALBUMIN", "AST", "ALT", "ALKPHOS", "BILITOT", "BILIDIR", "IBILI" in the last 8760 hours.   No results  found.   Iron deficiency anemia due to chronic blood loss #Iron deficient anemia likely secondary to chronic blood loss/menorrhagia.   # AUG 2023-  Ferritin-9; LOW-  proceed with IV venofer.again reminded the pat to statr gentle iron 1 pill a day; discussed that this should not cause stomach distress or cause constipation; over-the-counter. Pending iron studies/ ferritin today. Today Hb- 10  proceed with venofer  #Menorrhagia [uerine ]-s/p GYN evaluation- [place] aug 1st-2022 s/p D&C [Dr.jackson]- Imprpved; not resolved.   # DISPOSITION: # venofer today # venofer in 1 week # follow up in 6 month MD; labs- cbc/bmp; iron studies; ferritin/urine pregnancy- possible venofer Dr.B   All questions were answered. The patient knows to call the clinic with any problems, questions or concerns.    Earna Coder, MD 04/21/2023 1:31 PM

## 2023-04-21 NOTE — Progress Notes (Signed)
No concerns today 

## 2023-04-28 ENCOUNTER — Ambulatory Visit: Payer: Commercial Managed Care - PPO

## 2023-04-28 MED FILL — Iron Sucrose Inj 20 MG/ML (Fe Equiv): INTRAVENOUS | Qty: 10 | Status: AC

## 2023-04-29 ENCOUNTER — Inpatient Hospital Stay: Payer: Commercial Managed Care - PPO

## 2023-10-20 ENCOUNTER — Inpatient Hospital Stay: Payer: Commercial Managed Care - PPO | Admitting: Internal Medicine

## 2023-10-20 ENCOUNTER — Inpatient Hospital Stay: Payer: Commercial Managed Care - PPO

## 2024-08-10 DIAGNOSIS — N840 Polyp of corpus uteri: Secondary | ICD-10-CM | POA: Diagnosis not present

## 2024-08-10 DIAGNOSIS — J309 Allergic rhinitis, unspecified: Secondary | ICD-10-CM | POA: Diagnosis not present

## 2024-08-10 DIAGNOSIS — D251 Intramural leiomyoma of uterus: Secondary | ICD-10-CM | POA: Diagnosis not present

## 2024-08-10 DIAGNOSIS — Z1331 Encounter for screening for depression: Secondary | ICD-10-CM | POA: Diagnosis not present

## 2024-08-10 DIAGNOSIS — N920 Excessive and frequent menstruation with regular cycle: Secondary | ICD-10-CM | POA: Diagnosis not present

## 2024-08-10 DIAGNOSIS — E119 Type 2 diabetes mellitus without complications: Secondary | ICD-10-CM | POA: Diagnosis not present

## 2024-08-10 DIAGNOSIS — Z111 Encounter for screening for respiratory tuberculosis: Secondary | ICD-10-CM | POA: Diagnosis not present

## 2024-08-10 DIAGNOSIS — E559 Vitamin D deficiency, unspecified: Secondary | ICD-10-CM | POA: Diagnosis not present

## 2024-08-10 DIAGNOSIS — D500 Iron deficiency anemia secondary to blood loss (chronic): Secondary | ICD-10-CM | POA: Diagnosis not present

## 2024-08-10 DIAGNOSIS — Z0000 Encounter for general adult medical examination without abnormal findings: Secondary | ICD-10-CM | POA: Diagnosis not present

## 2024-11-15 ENCOUNTER — Encounter: Payer: Self-pay | Admitting: Internal Medicine

## 2024-11-15 ENCOUNTER — Other Ambulatory Visit: Payer: Self-pay

## 2024-11-15 MED ORDER — VITAMIN D (ERGOCALCIFEROL) 1.25 MG (50000 UNIT) PO CAPS
50000.0000 [IU] | ORAL_CAPSULE | ORAL | 1 refills | Status: AC
  Filled 2024-11-15: qty 12, 84d supply, fill #0

## 2024-11-15 MED ORDER — METFORMIN HCL ER 500 MG PO TB24
1000.0000 mg | ORAL_TABLET | Freq: Every day | ORAL | 1 refills | Status: AC
  Filled 2024-11-15: qty 180, 90d supply, fill #0

## 2024-11-27 ENCOUNTER — Other Ambulatory Visit: Payer: Self-pay
